# Patient Record
Sex: Female | Born: 1945
Health system: Southern US, Community
[De-identification: ages and names within clinical notes are randomized; demographics above are authoritative.]

## PROBLEM LIST (undated history)

## (undated) DIAGNOSIS — M810 Age-related osteoporosis without current pathological fracture: Secondary | ICD-10-CM

## (undated) DIAGNOSIS — C801 Malignant (primary) neoplasm, unspecified: Secondary | ICD-10-CM

## (undated) DIAGNOSIS — D649 Anemia, unspecified: Secondary | ICD-10-CM

## (undated) HISTORY — DX: Age-related osteoporosis without current pathological fracture: M81.0

## (undated) HISTORY — PX: NO PAST SURGERIES: SHX2092

## (undated) HISTORY — DX: Anemia, unspecified: D64.9

---

## 2007-10-23 ENCOUNTER — Ambulatory Visit: Payer: Self-pay | Admitting: Internal Medicine

## 2007-12-31 ENCOUNTER — Ambulatory Visit: Payer: Self-pay | Admitting: Gastroenterology

## 2009-03-24 ENCOUNTER — Ambulatory Visit: Payer: Self-pay | Admitting: Internal Medicine

## 2010-03-30 ENCOUNTER — Ambulatory Visit: Payer: Self-pay | Admitting: Internal Medicine

## 2011-02-23 LAB — HM PAP SMEAR

## 2011-04-19 ENCOUNTER — Ambulatory Visit: Payer: Self-pay | Admitting: Internal Medicine

## 2012-06-21 ENCOUNTER — Encounter: Payer: Self-pay | Admitting: *Deleted

## 2012-06-24 ENCOUNTER — Encounter: Payer: Self-pay | Admitting: Internal Medicine

## 2012-06-24 ENCOUNTER — Other Ambulatory Visit (HOSPITAL_COMMUNITY)
Admission: RE | Admit: 2012-06-24 | Discharge: 2012-06-24 | Disposition: A | Discharge status: Home / Self Care | Payer: Medicare Other | Source: Ambulatory Visit | Attending: Internal Medicine | Admitting: Internal Medicine

## 2012-06-24 ENCOUNTER — Ambulatory Visit (INDEPENDENT_AMBULATORY_CARE_PROVIDER_SITE_OTHER): Payer: Medicare Other | Admitting: Internal Medicine

## 2012-06-24 VITALS — BP 118/72 | HR 60 | Temp 98.1°F | Ht 66.0 in | Wt 124.0 lb

## 2012-06-24 DIAGNOSIS — Z0000 Encounter for general adult medical examination without abnormal findings: Secondary | ICD-10-CM

## 2012-06-24 DIAGNOSIS — R5383 Other fatigue: Secondary | ICD-10-CM

## 2012-06-24 DIAGNOSIS — Z01419 Encounter for gynecological examination (general) (routine) without abnormal findings: Secondary | ICD-10-CM | POA: Insufficient documentation

## 2012-06-24 DIAGNOSIS — R5381 Other malaise: Secondary | ICD-10-CM

## 2012-06-24 DIAGNOSIS — M810 Age-related osteoporosis without current pathological fracture: Secondary | ICD-10-CM

## 2012-06-24 DIAGNOSIS — Z139 Encounter for screening, unspecified: Secondary | ICD-10-CM

## 2012-06-24 DIAGNOSIS — E7800 Pure hypercholesterolemia, unspecified: Secondary | ICD-10-CM

## 2012-06-24 DIAGNOSIS — Z1151 Encounter for screening for human papillomavirus (HPV): Secondary | ICD-10-CM | POA: Insufficient documentation

## 2012-06-24 DIAGNOSIS — D649 Anemia, unspecified: Secondary | ICD-10-CM

## 2012-06-24 LAB — CBC WITH DIFFERENTIAL/PLATELET
Basophils Absolute: 0 10*3/uL (ref 0.0–0.1)
Basophils Relative: 0.6 % (ref 0.0–3.0)
Eosinophils Relative: 4 % (ref 0.0–5.0)
HCT: 41.4 % (ref 36.0–46.0)
Hemoglobin: 13.9 g/dL (ref 12.0–15.0)
Lymphs Abs: 2.4 10*3/uL (ref 0.7–4.0)
Monocytes Relative: 7.3 % (ref 3.0–12.0)
Neutro Abs: 2.6 10*3/uL (ref 1.4–7.7)
RBC: 4.37 Mil/uL (ref 3.87–5.11)
RDW: 13.4 % (ref 11.5–14.6)

## 2012-06-24 LAB — LDL CHOLESTEROL, DIRECT: Direct LDL: 157.7 mg/dL

## 2012-06-24 LAB — COMPREHENSIVE METABOLIC PANEL
BUN: 17 mg/dL (ref 6–23)
CO2: 28 mEq/L (ref 19–32)
Calcium: 8.9 mg/dL (ref 8.4–10.5)
Creatinine, Ser: 0.8 mg/dL (ref 0.4–1.2)
GFR: 77.94 mL/min (ref >60.00)
Glucose, Bld: 94 mg/dL (ref 70–99)
Total Bilirubin: 0.6 mg/dL (ref 0.3–1.2)

## 2012-06-24 LAB — TSH: TSH: 2.09 u[IU]/mL (ref 0.35–5.50)

## 2012-06-24 LAB — LIPID PANEL: Total CHOL/HDL Ratio: 5

## 2012-06-26 ENCOUNTER — Encounter: Payer: Self-pay | Admitting: *Deleted

## 2012-07-04 ENCOUNTER — Encounter: Payer: Self-pay | Admitting: Internal Medicine

## 2012-07-04 NOTE — Assessment & Plan Note (Addendum)
Schedule bone density.  Continues on fosamax, calcium and vitamin D.  Continue weight bearing exercises.  Check vitamin D level.

## 2012-07-04 NOTE — Progress Notes (Signed)
Subjective:    Patient ID: Sonya Hernandez, female    DOB: 06/12/1945, 67 y.o.   MRN: [id number]  HPI 67 year old female with past history of osteoporosis and documented iron deficient anemia who comes in today to follow up on these issues as well as for a complete physical exam.  She is exercising.  Some discomfort in her knees.  Left greater than right.  Occasionally will notice a pain in her back - occasionally a shooting pain down her leg.  Takes advil and this controls.  Desires no further intervention.  No significant acid reflux.  States if she eats too much - will notice some acid reflux.  If controls her diet, does not have issues.  Bowels move normally.  No chest pain or tightness.    Past Medical History  Diagnosis Date  . Anemia   . Osteoporosis     Current Outpatient Prescriptions on File Prior to Visit  Medication Sig Dispense Refill  . alendronate (FOSAMAX) 70 MG tablet Take 70 mg by mouth every 7 (seven) days. Take with a full glass of water on an empty stomach.      . Calcium Carbonate-Vitamin D 600-400 MG-UNIT per chew tablet Chew 1 tablet by mouth 2 (two) times daily.       . Cholecalciferol 1000 UNITS capsule Take 1,000 Units by mouth daily.      . ferrous sulfate 325 (65 FE) MG EC tablet Take 325 mg by mouth daily with breakfast.        Review of Systems Patient denies any headache, lightheadedness or dizziness.  No significant sinus or allergy symptoms.  No chest pain, tightness or palpitations.  No increased shortness of breath, cough or congestion.  No nausea or vomiting.  Acid reflux as outlined above.  If she controls the amount she eats, she has no problems.  No abdominal pain or cramping.  No bowel change, such as diarrhea, constipation, BRBPR or melana.  No urine change.   No vaginal problems.  Reported some fatigue, but overall she feels she is doing well.      Objective:   Physical Exam Filed Vitals:   06/24/12 0936  BP: 118/72  Pulse: 60  Temp: 98.1 F  (16.42 C)   67 year old female in no acute distress.   HEENT:  Nares- clear.  Oropharynx - without lesions. NECK:  Supple.  Nontender.  No audible bruit.  HEART:  Appears to be regular. LUNGS:  No crackles or wheezing audible.  Respirations even and unlabored.  RADIAL PULSE:  Equal bilaterally.    BREASTS:  No nipple discharge or nipple retraction present.  Could not appreciate any distinct nodules or axillary adenopathy.  ABDOMEN:  Soft, nontender.  Bowel sounds present and normal.  No audible abdominal bruit.  GU:  Normal external genitalia.  Vaginal vault without lesions.  Cervix identified.  Pap performed. Could not appreciate any adnexal masses or tenderness.   RECTAL:  Heme negative.   EXTREMITIES:  No increased edema present.  DP pulses palpable and equal bilaterally.      MSK:  No increased soft tissue swelling.  Good rom.       Assessment & Plan:  PREVIOUS WEIGHT LOSS.  Weight stable.  Over the last couple of years, weight averaging 122-124.  Follow.    FATIGUE.  Check cbc, met c and tsh.    GYN.  Pelvic/pap today.   HEALTH MAINTENANCE.  Physical today.  Pelvic/pap today.  Schedule mammogram.  Colonoscopy 12/31/07 revealed diverticulosis otherwise normal.  Mother had colon cancer.  Continue to follow up with GI.  IFOB.

## 2012-07-04 NOTE — Assessment & Plan Note (Signed)
On iron.  Check cbc/ferritin.  

## 2012-07-30 ENCOUNTER — Telehealth: Payer: Self-pay | Admitting: *Deleted

## 2012-07-30 NOTE — Telephone Encounter (Signed)
Called patient back about her continuing with her calcium and vitamin d.

## 2012-08-12 ENCOUNTER — Ambulatory Visit: Payer: Self-pay | Admitting: Internal Medicine

## 2012-08-14 ENCOUNTER — Ambulatory Visit: Payer: Self-pay | Admitting: Internal Medicine

## 2012-08-14 ENCOUNTER — Encounter: Payer: Self-pay | Admitting: Internal Medicine

## 2012-08-14 LAB — HM MAMMOGRAPHY

## 2012-08-16 ENCOUNTER — Telehealth: Payer: Self-pay | Admitting: Internal Medicine

## 2012-08-16 NOTE — Telephone Encounter (Signed)
error 

## 2012-09-02 ENCOUNTER — Encounter: Payer: Self-pay | Admitting: Internal Medicine

## 2012-09-23 ENCOUNTER — Other Ambulatory Visit: Payer: Self-pay | Admitting: *Deleted

## 2012-09-23 ENCOUNTER — Encounter: Payer: Self-pay | Admitting: *Deleted

## 2012-09-23 MED ORDER — ALENDRONATE SODIUM 70 MG PO TABS: 70.0000 mg | ORAL_TABLET | ORAL | Status: DC

## 2012-09-25 ENCOUNTER — Emergency Department: Payer: Self-pay | Admitting: Emergency Medicine

## 2012-09-25 LAB — COMPREHENSIVE METABOLIC PANEL
Albumin: 3.5 g/dL (ref 3.4–5.0)
Alkaline Phosphatase: 70 U/L (ref 50–136)
BUN: 21 mg/dL — ABNORMAL HIGH (ref 7–18)
Chloride: 106 mmol/L (ref 98–107)
EGFR (African American): >60
Glucose: 135 mg/dL — ABNORMAL HIGH (ref 65–99)
Potassium: 3.9 mmol/L (ref 3.5–5.1)
SGOT(AST): 33 U/L (ref 15–37)
SGPT (ALT): 26 U/L (ref 12–78)
Sodium: 138 mmol/L (ref 136–145)

## 2012-09-25 LAB — CBC WITH DIFFERENTIAL/PLATELET
Basophil %: 0.6 %
Eosinophil #: 0.2 10*3/uL (ref 0.0–0.7)
HCT: 40.2 % (ref 35.0–47.0)
Lymphocyte #: 2.8 10*3/uL (ref 1.0–3.6)
MCH: 31.4 pg (ref 26.0–34.0)
MCHC: 33.1 g/dL (ref 32.0–36.0)
Neutrophil #: 5.4 10*3/uL (ref 1.4–6.5)
Platelet: 238 10*3/uL (ref 150–440)
RBC: 4.23 10*6/uL (ref 3.80–5.20)

## 2012-09-26 ENCOUNTER — Ambulatory Visit: Payer: Self-pay | Admitting: Orthopedic Surgery

## 2012-11-04 ENCOUNTER — Encounter: Payer: Self-pay | Admitting: Orthopedic Surgery

## 2012-12-03 ENCOUNTER — Encounter: Payer: Self-pay | Admitting: Orthopedic Surgery

## 2013-01-03 ENCOUNTER — Encounter: Payer: Self-pay | Admitting: Orthopedic Surgery

## 2013-01-15 ENCOUNTER — Telehealth: Payer: Self-pay | Admitting: *Deleted

## 2013-01-15 MED ORDER — ALENDRONATE SODIUM 70 MG PO TABS: 70.0000 mg | ORAL_TABLET | ORAL | Status: DC

## 2013-01-15 NOTE — Telephone Encounter (Signed)
Refill Request  Alendronate sodium 70 mg  #4   Take one tablet by mouth once a week as directed

## 2013-01-15 NOTE — Telephone Encounter (Signed)
done

## 2013-02-18 ENCOUNTER — Other Ambulatory Visit (INDEPENDENT_AMBULATORY_CARE_PROVIDER_SITE_OTHER): Payer: Medicare Other

## 2013-02-18 DIAGNOSIS — Z139 Encounter for screening, unspecified: Secondary | ICD-10-CM

## 2013-02-18 LAB — FECAL OCCULT BLOOD, IMMUNOCHEMICAL: Fecal Occult Bld: NEGATIVE

## 2013-02-19 ENCOUNTER — Encounter: Payer: Self-pay | Admitting: *Deleted

## 2013-03-31 LAB — HM DEXA SCAN

## 2013-04-11 ENCOUNTER — Encounter: Payer: Self-pay | Admitting: Internal Medicine

## 2013-04-11 ENCOUNTER — Encounter: Payer: Self-pay | Admitting: *Deleted

## 2013-04-11 DIAGNOSIS — K5790 Diverticulosis of intestine, part unspecified, without perforation or abscess without bleeding: Secondary | ICD-10-CM

## 2013-06-02 ENCOUNTER — Ambulatory Visit: Payer: Self-pay | Admitting: Gastroenterology

## 2013-06-02 LAB — HM COLONOSCOPY

## 2013-06-23 ENCOUNTER — Encounter: Payer: Medicare Other | Admitting: Internal Medicine

## 2013-06-25 ENCOUNTER — Ambulatory Visit (INDEPENDENT_AMBULATORY_CARE_PROVIDER_SITE_OTHER): Payer: Medicare Other | Admitting: Internal Medicine

## 2013-06-25 ENCOUNTER — Encounter: Payer: Self-pay | Admitting: Internal Medicine

## 2013-06-25 ENCOUNTER — Other Ambulatory Visit: Payer: Self-pay | Admitting: Internal Medicine

## 2013-06-25 ENCOUNTER — Encounter (INDEPENDENT_AMBULATORY_CARE_PROVIDER_SITE_OTHER): Payer: Self-pay

## 2013-06-25 VITALS — BP 110/70 | HR 59 | Temp 98.0°F | Ht 65.25 in | Wt 121.5 lb

## 2013-06-25 DIAGNOSIS — D649 Anemia, unspecified: Secondary | ICD-10-CM

## 2013-06-25 DIAGNOSIS — S62101A Fracture of unspecified carpal bone, right wrist, initial encounter for closed fracture: Secondary | ICD-10-CM

## 2013-06-25 DIAGNOSIS — M810 Age-related osteoporosis without current pathological fracture: Secondary | ICD-10-CM

## 2013-06-25 DIAGNOSIS — S62109A Fracture of unspecified carpal bone, unspecified wrist, initial encounter for closed fracture: Secondary | ICD-10-CM

## 2013-06-25 DIAGNOSIS — E875 Hyperkalemia: Secondary | ICD-10-CM

## 2013-06-25 DIAGNOSIS — E7800 Pure hypercholesterolemia, unspecified: Secondary | ICD-10-CM

## 2013-06-25 DIAGNOSIS — Z23 Encounter for immunization: Secondary | ICD-10-CM

## 2013-06-25 LAB — COMPREHENSIVE METABOLIC PANEL
ALBUMIN: 3.7 g/dL (ref 3.5–5.2)
ALK PHOS: 74 U/L (ref 39–117)
ALT: 18 U/L (ref 0–35)
AST: 24 U/L (ref 0–37)
BUN: 13 mg/dL (ref 6–23)
CALCIUM: 9.2 mg/dL (ref 8.4–10.5)
CHLORIDE: 106 meq/L (ref 96–112)
CO2: 30 mEq/L (ref 19–32)
Creatinine, Ser: 0.8 mg/dL (ref 0.4–1.2)
GFR: 80.07 mL/min (ref >60.00)
GLUCOSE: 92 mg/dL (ref 70–99)
Potassium: 5.1 mEq/L (ref 3.5–5.1)
SODIUM: 140 meq/L (ref 135–145)
TOTAL PROTEIN: 6.5 g/dL (ref 6.0–8.3)
Total Bilirubin: 0.6 mg/dL (ref 0.3–1.2)

## 2013-06-25 LAB — CBC WITH DIFFERENTIAL/PLATELET
BASOS PCT: 0.4 % (ref 0.0–3.0)
Basophils Absolute: 0 10*3/uL (ref 0.0–0.1)
EOS PCT: 3.4 % (ref 0.0–5.0)
Eosinophils Absolute: 0.2 10*3/uL (ref 0.0–0.7)
HCT: 39.1 % (ref 36.0–46.0)
Hemoglobin: 13.1 g/dL (ref 12.0–15.0)
Lymphocytes Relative: 45.9 % (ref 12.0–46.0)
Lymphs Abs: 2.5 10*3/uL (ref 0.7–4.0)
MCHC: 33.5 g/dL (ref 30.0–36.0)
MCV: 96.1 fl (ref 78.0–100.0)
MONO ABS: 0.4 10*3/uL (ref 0.1–1.0)
MONOS PCT: 7.3 % (ref 3.0–12.0)
Neutro Abs: 2.3 10*3/uL (ref 1.4–7.7)
Neutrophils Relative %: 43 % (ref 43.0–77.0)
Platelets: 231 10*3/uL (ref 150.0–400.0)
RBC: 4.07 Mil/uL (ref 3.87–5.11)
RDW: 13.2 % (ref 11.5–14.6)
WBC: 5.4 10*3/uL (ref 4.5–10.5)

## 2013-06-25 LAB — TSH: TSH: 1 u[IU]/mL (ref 0.35–5.50)

## 2013-06-25 LAB — LIPID PANEL
CHOLESTEROL: 191 mg/dL (ref 0–200)
HDL: 47.3 mg/dL (ref >39.00)
LDL Cholesterol: 132 mg/dL — ABNORMAL HIGH (ref 0–99)
Total CHOL/HDL Ratio: 4
Triglycerides: 61 mg/dL (ref 0.0–149.0)
VLDL: 12.2 mg/dL (ref 0.0–40.0)

## 2013-06-25 MED ORDER — HYDROCORTISONE ACE-PRAMOXINE 1-1 % RE CREA: 1.0000 "application " | TOPICAL_CREAM | Freq: Two times a day (BID) | RECTAL | Status: DC

## 2013-06-25 NOTE — Progress Notes (Signed)
Lab ordered.

## 2013-06-25 NOTE — Progress Notes (Signed)
Pre-visit discussion using our clinic review tool. No additional management support is needed unless otherwise documented below in the visit note.  

## 2013-06-25 NOTE — Progress Notes (Signed)
Orders placed for labs

## 2013-06-26 LAB — VITAMIN D 25 HYDROXY (VIT D DEFICIENCY, FRACTURES): VIT D 25 HYDROXY: 44 ng/mL (ref 30–89)

## 2013-06-27 ENCOUNTER — Encounter: Payer: Self-pay | Admitting: Internal Medicine

## 2013-06-29 ENCOUNTER — Encounter: Payer: Self-pay | Admitting: Internal Medicine

## 2013-06-29 DIAGNOSIS — S62101A Fracture of unspecified carpal bone, right wrist, initial encounter for closed fracture: Secondary | ICD-10-CM | POA: Insufficient documentation

## 2013-06-29 NOTE — Assessment & Plan Note (Signed)
On iron.  Check cbc/ferritin.  

## 2013-06-29 NOTE — Progress Notes (Signed)
Subjective:    Patient ID: Sonya Hernandez, female    DOB: 1945/11/27, 68 y.o.   MRN: [id number]  HPI 68 year old female with past history of osteoporosis and documented iron deficient anemia who comes in today to follow up on these issues as well as for a complete physical exam.  She is exercising.  No cardiac symptoms with increased activity or exertion.   No significant acid reflux.  Bowels move normally.  Reports occasional loose stool.  Will occasionally feel a tear with having a bowel movement.  Some BRB at times with wiping.  Just had a colonoscopy.  Performed by Dr Gustavo Lah.  No abdominal pain or cramping.  Did have a right wrist fracture.  States wrist shattered.  Has been released.  Not able to grip as well.  Saw Dr Rudene Christians.      Past Medical History  Diagnosis Date  . Anemia   . Osteoporosis     Current Outpatient Prescriptions on File Prior to Visit  Medication Sig Dispense Refill  . alendronate (FOSAMAX) 70 MG tablet Take 1 tablet (70 mg total) by mouth every 7 (seven) days. Take with a full glass of water on an empty stomach.  4 tablet  6  . Calcium Carbonate-Vitamin D 600-400 MG-UNIT per chew tablet Chew 1 tablet by mouth 2 (two) times daily.       . Cholecalciferol 1000 UNITS capsule Take 1,000 Units by mouth daily.      Marland Kitchen estradiol (ESTRACE) 0.1 MG/GM vaginal cream Place 2 g vaginally 3 (three) times a week.       . ferrous sulfate 325 (65 FE) MG EC tablet Take 325 mg by mouth daily with breakfast.       No current facility-administered medications on file prior to visit.    Review of Systems Patient denies any headache, lightheadedness or dizziness.  No significant sinus or allergy symptoms.  No chest pain, tightness or palpitations.  No increased shortness of breath, cough or congestion.  No nausea or vomiting.  No acid reflux reported.  No abdominal pain or cramping.  No significant bowel change.  Does report the BRB as outlined.  No urine change.   No vaginal problems.   Reported some fatigue, but overall she feels she is doing well.  Eating well.       Objective:   Physical Exam  Filed Vitals:   06/25/13 0915  BP: 110/70  Pulse: 59  Temp: 98 F (43.48 C)   68 year old female in no acute distress.   HEENT:  Nares- clear.  Oropharynx - without lesions. NECK:  Supple.  Nontender.  No audible bruit.  HEART:  Appears to be regular. LUNGS:  No crackles or wheezing audible.  Respirations even and unlabored.  RADIAL PULSE:  Equal bilaterally.    BREASTS:  No nipple discharge or nipple retraction present.  Could not appreciate any distinct nodules or axillary adenopathy.  ABDOMEN:  Soft, nontender.  Bowel sounds present and normal.  No audible abdominal bruit.  GU:  Normal external genitalia.  Vaginal vault without lesions.  Cervix identified.  Pap performed. Could not appreciate any adnexal masses or tenderness.   RECTAL:  Heme negative.   EXTREMITIES:  No increased edema present.  DP pulses palpable and equal bilaterally.      MSK:  No increased soft tissue swelling.  Good rom.       Assessment & Plan:  PREVIOUS WEIGHT LOSS.  Weight stable.  Over the last  couple of years, weight averaging 122-124.  Follow.    FATIGUE.  Check cbc, met c and tsh.    GYN.  Pap 06/24/12 negative with negative HPV.    HEALTH MAINTENANCE.  Physical today.  Pelvic/pap 06/24/12 negative with negative HPV.  Schedule mammogram when due.  Last mammogram 08/12/12 recommended f/u views.  F/u mammogram 08/14/12 - Birads II.   Colonoscopy 12/31/07 revealed diverticulosis otherwise normal.  Mother had colon cancer.  Just had f/u colonoscopy.  States ok.    I spent more 25 minutes with the patient and more than 50% of the time was spent in consultation regarding the above.

## 2013-06-29 NOTE — Assessment & Plan Note (Signed)
S/p fracture.  Saw Dr Menz.  Stable.    

## 2013-06-29 NOTE — Assessment & Plan Note (Signed)
Schedule bone density.  Continues on fosamaxand vitamin D.  Continue weight bearing exercises.  Follow vitamin D level.

## 2013-07-10 ENCOUNTER — Encounter: Payer: Self-pay | Admitting: Internal Medicine

## 2013-07-11 DIAGNOSIS — K5790 Diverticulosis of intestine, part unspecified, without perforation or abscess without bleeding: Secondary | ICD-10-CM | POA: Insufficient documentation

## 2013-07-23 ENCOUNTER — Encounter: Payer: Self-pay | Admitting: *Deleted

## 2013-07-23 ENCOUNTER — Other Ambulatory Visit (INDEPENDENT_AMBULATORY_CARE_PROVIDER_SITE_OTHER): Payer: Medicare Other

## 2013-07-23 DIAGNOSIS — E875 Hyperkalemia: Secondary | ICD-10-CM

## 2013-07-23 LAB — POTASSIUM: Potassium: 4.2 mEq/L (ref 3.5–5.1)

## 2013-08-03 ENCOUNTER — Encounter: Payer: Self-pay | Admitting: Internal Medicine

## 2013-08-07 ENCOUNTER — Encounter: Payer: Self-pay | Admitting: Internal Medicine

## 2013-08-21 ENCOUNTER — Other Ambulatory Visit: Payer: Self-pay | Admitting: *Deleted

## 2013-08-21 MED ORDER — ALENDRONATE SODIUM 70 MG PO TABS
70.0000 mg | ORAL_TABLET | ORAL | Status: DC
Start: 2013-08-21 — End: 2014-03-05

## 2013-12-23 ENCOUNTER — Ambulatory Visit (INDEPENDENT_AMBULATORY_CARE_PROVIDER_SITE_OTHER): Payer: Medicare Other | Admitting: Internal Medicine

## 2013-12-23 ENCOUNTER — Encounter: Payer: Self-pay | Admitting: *Deleted

## 2013-12-23 ENCOUNTER — Encounter: Payer: Self-pay | Admitting: Internal Medicine

## 2013-12-23 VITALS — BP 100/58 | HR 61 | Temp 97.9°F | Ht 65.25 in | Wt 127.8 lb

## 2013-12-23 DIAGNOSIS — S42309S Unspecified fracture of shaft of humerus, unspecified arm, sequela: Secondary | ICD-10-CM

## 2013-12-23 DIAGNOSIS — E7800 Pure hypercholesterolemia, unspecified: Secondary | ICD-10-CM

## 2013-12-23 DIAGNOSIS — S62101S Fracture of unspecified carpal bone, right wrist, sequela: Secondary | ICD-10-CM

## 2013-12-23 DIAGNOSIS — K5730 Diverticulosis of large intestine without perforation or abscess without bleeding: Secondary | ICD-10-CM

## 2013-12-23 DIAGNOSIS — D649 Anemia, unspecified: Secondary | ICD-10-CM

## 2013-12-23 DIAGNOSIS — M810 Age-related osteoporosis without current pathological fracture: Secondary | ICD-10-CM

## 2013-12-23 LAB — LIPID PANEL
CHOLESTEROL: 179 mg/dL (ref 0–200)
HDL: 48.7 mg/dL (ref >39.00)
LDL CALC: 120 mg/dL — AB (ref 0–99)
NonHDL: 130.3
Total CHOL/HDL Ratio: 4
Triglycerides: 51 mg/dL (ref 0.0–149.0)
VLDL: 10.2 mg/dL (ref 0.0–40.0)

## 2013-12-23 NOTE — Progress Notes (Signed)
Pre visit review using our clinic review tool, if applicable. No additional management support is needed unless otherwise documented below in the visit note. 

## 2013-12-24 ENCOUNTER — Encounter: Payer: Self-pay | Admitting: Internal Medicine

## 2013-12-24 NOTE — Assessment & Plan Note (Signed)
On iron.  Follow cbc and ferritin.  

## 2013-12-24 NOTE — Progress Notes (Signed)
Subjective:    Patient ID: Sonya Hernandez, female    DOB: 1946/01/03, 68 y.o.   MRN: [id number]  HPI 68 year old female with past history of osteoporosis and documented iron deficient anemia who comes in today for a scheduled follow up.   She is exercising.  No cardiac symptoms with increased activity or exertion.   No significant acid reflux.  No reported problems with her bowels.  Had a colonoscopy.  Performed by Dr Gustavo Lah.  No abdominal pain or cramping.  Did have a right wrist fracture.  States wrist shattered.  Has been released.  Overall doing well.  Will still have some discomfort in the wrist - at times.  Overall feels good.        Past Medical History  Diagnosis Date  . Anemia   . Osteoporosis     Current Outpatient Prescriptions on File Prior to Visit  Medication Sig Dispense Refill  . alendronate (FOSAMAX) 70 MG tablet Take 1 tablet (70 mg total) by mouth every 7 (seven) days. Take with a full glass of water on an empty stomach.  4 tablet  5  . Calcium Carbonate-Vitamin D 600-400 MG-UNIT per chew tablet Chew 1 tablet by mouth 2 (two) times daily.       . Cholecalciferol 1000 UNITS capsule Take 1,000 Units by mouth daily.      . clobetasol cream (TEMOVATE) 1.66 % Apply 1 application topically 2 (two) times a week.      . estradiol (ESTRACE) 0.1 MG/GM vaginal cream Place 2 g vaginally 3 (three) times a week.       . ferrous sulfate 325 (65 FE) MG EC tablet Take 325 mg by mouth daily with breakfast.      . pramoxine-hydrocortisone (ANALPRAM-HC) 1-1 % rectal cream Place 1 application rectally 2 (two) times daily.  30 g  0   No current facility-administered medications on file prior to visit.    Review of Systems Patient denies any headache, lightheadedness or dizziness.  No significant sinus or allergy symptoms.  No chest pain, tightness or palpitations.  No increased shortness of breath, cough or congestion.  No nausea or vomiting.  No acid reflux reported.  No abdominal pain  or cramping.  No bowel problems reported.   No urine change.   No vaginal problems.  Eating well.  Weight up a few pounds.       Objective:   Physical Exam  Filed Vitals:   12/23/13 0801  BP: 100/58  Pulse: 61  Temp: 97.9 F (69.21 C)   68 year old female in no acute distress.   HEENT:  Nares- clear.  Oropharynx - without lesions. NECK:  Supple.  Nontender.  No audible bruit.  HEART:  Appears to be regular. LUNGS:  No crackles or wheezing audible.  Respirations even and unlabored.  RADIAL PULSE:  Equal bilaterally.  ABDOMEN:  Soft, nontender.  Bowel sounds present and normal.  No audible abdominal bruit.   EXTREMITIES:  No increased edema present.  DP pulses palpable and equal bilaterally.       Assessment & Plan:  PREVIOUS WEIGHT LOSS.  Weight up a few pounds.   Follow.    GYN.  Pap 06/24/12 negative with negative HPV.    HEALTH MAINTENANCE.  Physical 06/25/13.  Pelvic/pap 06/24/12 negative with negative HPV.  Last mammogram 08/12/12 recommended f/u views.  F/u mammogram 08/14/12 - Birads II.   She missed her previous mammogram and had to reschedule.  She is scheduled  for f/u mammogram 9/15.  Colonoscopy 12/31/07 revealed diverticulosis otherwise normal.  Mother had colon cancer.  Follow up colonoscopy 12/14 - diverticulosis.  Recommended f/u colonoscopy in five years.

## 2013-12-24 NOTE — Assessment & Plan Note (Signed)
No bowel problems reported.  Follow.  Colonoscopy as outlined.

## 2013-12-24 NOTE — Assessment & Plan Note (Signed)
Continues on fosamax and vitamin D.  Continue weight bearing exercises.  Follow vitamin D level.  Last check wnl.  Recent bone density - hip improved.

## 2013-12-24 NOTE — Assessment & Plan Note (Signed)
S/p fracture.  Saw Dr Rudene Christians.  Stable.

## 2014-02-16 ENCOUNTER — Encounter: Payer: Self-pay | Admitting: *Deleted

## 2014-02-16 ENCOUNTER — Ambulatory Visit: Payer: Self-pay | Admitting: Internal Medicine

## 2014-02-16 LAB — HM MAMMOGRAPHY: HM Mammogram: NEGATIVE

## 2014-03-05 ENCOUNTER — Other Ambulatory Visit: Payer: Self-pay | Admitting: *Deleted

## 2014-03-05 MED ORDER — ALENDRONATE SODIUM 70 MG PO TABS: 70.0000 mg | ORAL_TABLET | ORAL | Status: DC

## 2014-06-25 ENCOUNTER — Ambulatory Visit (INDEPENDENT_AMBULATORY_CARE_PROVIDER_SITE_OTHER): Payer: Medicare Other | Admitting: Internal Medicine

## 2014-06-25 ENCOUNTER — Encounter: Payer: Self-pay | Admitting: Internal Medicine

## 2014-06-25 VITALS — BP 110/70 | HR 54 | Temp 98.2°F | Ht 65.0 in | Wt 125.2 lb

## 2014-06-25 DIAGNOSIS — K5730 Diverticulosis of large intestine without perforation or abscess without bleeding: Secondary | ICD-10-CM | POA: Diagnosis not present

## 2014-06-25 DIAGNOSIS — Z13220 Encounter for screening for lipoid disorders: Secondary | ICD-10-CM | POA: Diagnosis not present

## 2014-06-25 DIAGNOSIS — D649 Anemia, unspecified: Secondary | ICD-10-CM | POA: Diagnosis not present

## 2014-06-25 DIAGNOSIS — Z23 Encounter for immunization: Secondary | ICD-10-CM | POA: Diagnosis not present

## 2014-06-25 DIAGNOSIS — M810 Age-related osteoporosis without current pathological fracture: Secondary | ICD-10-CM | POA: Diagnosis not present

## 2014-06-25 LAB — CBC WITH DIFFERENTIAL/PLATELET
BASOS PCT: 0.5 % (ref 0.0–3.0)
Basophils Absolute: 0 10*3/uL (ref 0.0–0.1)
EOS ABS: 0.2 10*3/uL (ref 0.0–0.7)
EOS PCT: 4 % (ref 0.0–5.0)
HCT: 40.4 % (ref 36.0–46.0)
Hemoglobin: 13.7 g/dL (ref 12.0–15.0)
Lymphocytes Relative: 46.5 % — ABNORMAL HIGH (ref 12.0–46.0)
Lymphs Abs: 2.7 10*3/uL (ref 0.7–4.0)
MCHC: 33.9 g/dL (ref 30.0–36.0)
MCV: 93.8 fl (ref 78.0–100.0)
MONOS PCT: 7.1 % (ref 3.0–12.0)
Monocytes Absolute: 0.4 10*3/uL (ref 0.1–1.0)
Neutro Abs: 2.5 10*3/uL (ref 1.4–7.7)
Neutrophils Relative %: 41.9 % — ABNORMAL LOW (ref 43.0–77.0)
PLATELETS: 224 10*3/uL (ref 150.0–400.0)
RBC: 4.31 Mil/uL (ref 3.87–5.11)
RDW: 14 % (ref 11.5–15.5)
WBC: 5.9 10*3/uL (ref 4.0–10.5)

## 2014-06-25 LAB — FERRITIN: FERRITIN: 64.2 ng/mL (ref 10.0–291.0)

## 2014-06-25 NOTE — Progress Notes (Signed)
Pre visit review using our clinic review tool, if applicable. No additional management support is needed unless otherwise documented below in the visit note. 

## 2014-06-25 NOTE — Progress Notes (Signed)
Subjective:    Patient ID: Sonya Hernandez, female    DOB: 1945-07-20, 69 y.o.   MRN: [id number]  HPI 69 year old female with past history of osteoporosis and documented iron deficient anemia who comes in today to follow up on these issues as well as for a complete physical exam.   She is exercising.  No cardiac symptoms with increased activity or exertion.   No significant acid reflux.  No reported problems with her bowels.  No abdominal pain or cramping.   Overall doing well.  Overall feels good.        Past Medical History  Diagnosis Date  . Anemia   . Osteoporosis     Current Outpatient Prescriptions on File Prior to Visit  Medication Sig Dispense Refill  . alendronate (FOSAMAX) 70 MG tablet Take 1 tablet (70 mg total) by mouth every 7 (seven) days. Take with a full glass of water on an empty stomach. 4 tablet 5  . Calcium Carbonate-Vitamin D 600-400 MG-UNIT per chew tablet Chew 1 tablet by mouth 2 (two) times daily.     . Cholecalciferol 1000 UNITS capsule Take 1,000 Units by mouth daily.    . clobetasol cream (TEMOVATE) 3.41 % Apply 1 application topically 2 (two) times a week.    . estradiol (ESTRACE) 0.1 MG/GM vaginal cream Place 2 g vaginally 3 (three) times a week.     . ferrous sulfate 325 (65 FE) MG EC tablet Take 325 mg by mouth daily with breakfast.     No current facility-administered medications on file prior to visit.    Review of Systems Patient denies any headache, lightheadedness or dizziness.  No significant sinus or allergy symptoms.  No chest pain, tightness or palpitations.  No increased shortness of breath, cough or congestion.  No nausea or vomiting.  No acid reflux reported.  No abdominal pain or cramping.  No bowel problems reported.   No urine change.   No vaginal problems.  Eating well.  Weight overall stable.       Objective:   Physical Exam  Filed Vitals:   06/25/14 0835  BP: 110/70  Pulse: 54  Temp: 98.2 F (36.8 C)   Blood pressure recheck:   36/84  69 year old female in no acute distress.   HEENT:  Nares- clear.  Oropharynx - without lesions. NECK:  Supple.  Nontender.  No audible bruit.  HEART:  Appears to be regular. LUNGS:  No crackles or wheezing audible.  Respirations even and unlabored.  RADIAL PULSE:  Equal bilaterally.    BREASTS:  No nipple discharge or nipple retraction present.  Could not appreciate any distinct nodules or axillary adenopathy.  ABDOMEN:  Soft, nontender.  Bowel sounds present and normal.  No audible abdominal bruit.  GU:  Not performed.     EXTREMITIES:  No increased edema present.  DP pulses palpable and equal bilaterally.          Assessment & Plan:  Encounter for immunization Flu shot given today.    Osteoporosis Had bone density in 2014.  Improved.  Still osteoporosis.  Follow.  - Comprehensive metabolic panel; Future - TSH; Future - Vit D  25 hydroxy (rtn osteoporosis monitoring); Future  Anemia, unspecified anemia type On iron.  Check today and see if can adjust iron dosing.   - CBC with Differential - Ferritin  Diverticulosis of large intestine without hemorrhage Colonoscopy 12/14 - diverticulosis.  Stable.    Screening cholesterol level - Lipid panel;  Future  PREVIOUS WEIGHT LOSS.  Weight stable.   Follow.    GYN.  Pap 06/24/12 negative with negative HPV.    HEALTH MAINTENANCE.  Physical today.  Pelvic/pap 06/24/12 negative with negative HPV.  Mammogram 08/12/12 recommended f/u views.  F/u mammogram 08/14/12 - Birads II.   Mammogram 02/16/14 - Birads I.   Colonoscopy 12/31/07 revealed diverticulosis otherwise normal.  Mother had colon cancer.  Follow up colonoscopy 12/14 - diverticulosis.  Recommended f/u colonoscopy in five years.      I spent 25 minutes with the patient and more than 50% of the time was spent in consultation regarding the above.

## 2014-06-26 ENCOUNTER — Encounter: Payer: Self-pay | Admitting: Internal Medicine

## 2014-06-29 ENCOUNTER — Other Ambulatory Visit: Payer: Self-pay | Admitting: Internal Medicine

## 2014-06-29 DIAGNOSIS — D649 Anemia, unspecified: Secondary | ICD-10-CM

## 2014-06-29 NOTE — Progress Notes (Signed)
Orders placed for f/u cbc and ferritin.   

## 2014-09-16 DIAGNOSIS — M955 Acquired deformity of pelvis: Secondary | ICD-10-CM | POA: Diagnosis not present

## 2014-09-16 DIAGNOSIS — M9904 Segmental and somatic dysfunction of sacral region: Secondary | ICD-10-CM | POA: Diagnosis not present

## 2014-09-16 DIAGNOSIS — M5136 Other intervertebral disc degeneration, lumbar region: Secondary | ICD-10-CM | POA: Diagnosis not present

## 2014-09-16 DIAGNOSIS — M9903 Segmental and somatic dysfunction of lumbar region: Secondary | ICD-10-CM | POA: Diagnosis not present

## 2014-09-16 DIAGNOSIS — M9905 Segmental and somatic dysfunction of pelvic region: Secondary | ICD-10-CM | POA: Diagnosis not present

## 2014-09-17 ENCOUNTER — Other Ambulatory Visit: Payer: Self-pay | Admitting: *Deleted

## 2014-09-17 MED ORDER — ALENDRONATE SODIUM 70 MG PO TABS: 70.0000 mg | ORAL_TABLET | ORAL | Status: DC

## 2014-09-18 DIAGNOSIS — M9904 Segmental and somatic dysfunction of sacral region: Secondary | ICD-10-CM | POA: Diagnosis not present

## 2014-09-18 DIAGNOSIS — M9905 Segmental and somatic dysfunction of pelvic region: Secondary | ICD-10-CM | POA: Diagnosis not present

## 2014-09-18 DIAGNOSIS — M955 Acquired deformity of pelvis: Secondary | ICD-10-CM | POA: Diagnosis not present

## 2014-09-18 DIAGNOSIS — M9903 Segmental and somatic dysfunction of lumbar region: Secondary | ICD-10-CM | POA: Diagnosis not present

## 2014-09-18 DIAGNOSIS — M5136 Other intervertebral disc degeneration, lumbar region: Secondary | ICD-10-CM | POA: Diagnosis not present

## 2014-09-21 DIAGNOSIS — M5136 Other intervertebral disc degeneration, lumbar region: Secondary | ICD-10-CM | POA: Diagnosis not present

## 2014-09-21 DIAGNOSIS — M955 Acquired deformity of pelvis: Secondary | ICD-10-CM | POA: Diagnosis not present

## 2014-09-21 DIAGNOSIS — M9903 Segmental and somatic dysfunction of lumbar region: Secondary | ICD-10-CM | POA: Diagnosis not present

## 2014-09-21 DIAGNOSIS — M9904 Segmental and somatic dysfunction of sacral region: Secondary | ICD-10-CM | POA: Diagnosis not present

## 2014-09-21 DIAGNOSIS — M9905 Segmental and somatic dysfunction of pelvic region: Secondary | ICD-10-CM | POA: Diagnosis not present

## 2014-09-23 DIAGNOSIS — M955 Acquired deformity of pelvis: Secondary | ICD-10-CM | POA: Diagnosis not present

## 2014-09-23 DIAGNOSIS — M5136 Other intervertebral disc degeneration, lumbar region: Secondary | ICD-10-CM | POA: Diagnosis not present

## 2014-09-23 DIAGNOSIS — M9905 Segmental and somatic dysfunction of pelvic region: Secondary | ICD-10-CM | POA: Diagnosis not present

## 2014-09-23 DIAGNOSIS — M9904 Segmental and somatic dysfunction of sacral region: Secondary | ICD-10-CM | POA: Diagnosis not present

## 2014-09-23 DIAGNOSIS — M9903 Segmental and somatic dysfunction of lumbar region: Secondary | ICD-10-CM | POA: Diagnosis not present

## 2014-09-25 NOTE — Op Note (Signed)
PATIENT NAME:  Sonya Hernandez, Sonya Hernandez MR#:  [id number] DATE OF BIRTH:  [birth year]  DATE OF PROCEDURE:    PREOPERATIVE DIAGNOSIS:  Right distal radius fracture and ulna fracture, comminuted, displaced.  POSTOPERATIVE DIAGNOSIS:  Right distal radius fracture and ulna fracture, comminuted, displaced.   PROCEDURE:  Open reduction internal fixation right distal radius.   ANESTHESIA:  General.   SURGEON:  Laurene Footman, M.D.   DESCRIPTION OF PROCEDURE:  The patient was brought to the operating room and after adequate general anesthesia was obtained the right arm was prepped and draped in the usual sterile fashion.  Tourniquet applied to the upper arm.  After patient identification and timeout procedures were completed, fingertrap traction through the index and middle finger was applied with approximately 10 pounds.  The tourniquet raised.  At this point a skin volar approach was made centered over the FCR tendon from the distal wrist crease proximally.  The flexor sheath was incised and the tendon retracted radially.  The deep tendon sheath was then incised and the pronator elevated off the distal fragment.  There was extensive comminution with significant displacement of the distal fragments.  With mini C-arm for fluoroscopy, closed reduction was carried out with open reduction of some of these volar fragments.  A freer was used to elevate the dorsal distal fragment to get it in near anatomic alignment.  A standard short DVR plate from [employer] was then applied and put in the appropriate position along the distal radius.  A K wire was used to hold it in place and proximal screws inserted.  With the wrist in ulnar deviation and flexion, the K wire was used to give provisional fixation to the styloid fracture.  This restored radial inclination.  The smooth pegs were then used to fill the remaining holes of the distal plate with one multiaxis  screw to prevent impingement on the distal radial ulnar joint.   After these peg holes had been filled, traction was released and under fluoroscopic examination there appeared to be stable fixation of the distal fragments.  The radial inclination had been restored along with length as well as some volar angulation.  The ulna appeared to be anatomically aligned at this point and no additional fixation was placed in the ulna.  The tourniquet was let down.  The wound irrigated.  There is no significant bleeding.  The wound was closed with 3-0 Vicryl subcutaneously and 4-0 nylon for the skin.  Xeroform, 4 x 4's, Webril, and a volar splint maintaining the wrist in neutral were applied.  The patient was sent to the recovery room in stable condition.   ESTIMATED BLOOD LOSS:  Minimal.   COMPLICATIONS:  None.   SPECIMEN:  None.   TOURNIQUET TIME:  Was 42 minutes at 250 mmHg.   IMPLANT:  [employer], short standard volar locking plate with screws and pegs.     ____________________________ Laurene Footman, MD [email or web address] D: 09/26/2012 16:03:06 ET T: 09/26/2012 19:57:59 ET JOB#: [id number]  cc: Laurene Footman, MD, <Dictator> Laurene Footman MD ELECTRONICALLY SIGNED 09/26/2012 21:10

## 2014-09-28 ENCOUNTER — Other Ambulatory Visit (INDEPENDENT_AMBULATORY_CARE_PROVIDER_SITE_OTHER): Payer: Medicare Other

## 2014-09-28 DIAGNOSIS — M810 Age-related osteoporosis without current pathological fracture: Secondary | ICD-10-CM | POA: Diagnosis not present

## 2014-09-28 DIAGNOSIS — D649 Anemia, unspecified: Secondary | ICD-10-CM | POA: Diagnosis not present

## 2014-09-28 DIAGNOSIS — Z79899 Other long term (current) drug therapy: Secondary | ICD-10-CM | POA: Diagnosis not present

## 2014-09-28 DIAGNOSIS — M9904 Segmental and somatic dysfunction of sacral region: Secondary | ICD-10-CM | POA: Diagnosis not present

## 2014-09-28 DIAGNOSIS — Z13220 Encounter for screening for lipoid disorders: Secondary | ICD-10-CM

## 2014-09-28 DIAGNOSIS — M9903 Segmental and somatic dysfunction of lumbar region: Secondary | ICD-10-CM | POA: Diagnosis not present

## 2014-09-28 DIAGNOSIS — M955 Acquired deformity of pelvis: Secondary | ICD-10-CM | POA: Diagnosis not present

## 2014-09-28 DIAGNOSIS — M9905 Segmental and somatic dysfunction of pelvic region: Secondary | ICD-10-CM | POA: Diagnosis not present

## 2014-09-28 DIAGNOSIS — M5136 Other intervertebral disc degeneration, lumbar region: Secondary | ICD-10-CM | POA: Diagnosis not present

## 2014-09-28 LAB — COMPREHENSIVE METABOLIC PANEL
ALBUMIN: 4 g/dL (ref 3.5–5.2)
ALT: 17 U/L (ref 0–35)
AST: 23 U/L (ref 0–37)
Alkaline Phosphatase: 65 U/L (ref 39–117)
BUN: 14 mg/dL (ref 6–23)
CO2: 30 mEq/L (ref 19–32)
Calcium: 9.6 mg/dL (ref 8.4–10.5)
Chloride: 104 mEq/L (ref 96–112)
Creatinine, Ser: 0.74 mg/dL (ref 0.40–1.20)
GFR: 82.27 mL/min (ref >60.00)
Glucose, Bld: 89 mg/dL (ref 70–99)
POTASSIUM: 4.1 meq/L (ref 3.5–5.1)
SODIUM: 137 meq/L (ref 135–145)
TOTAL PROTEIN: 6.7 g/dL (ref 6.0–8.3)
Total Bilirubin: 0.4 mg/dL (ref 0.2–1.2)

## 2014-09-28 LAB — FERRITIN: Ferritin: 59.7 ng/mL (ref 10.0–291.0)

## 2014-09-28 LAB — CBC WITH DIFFERENTIAL/PLATELET
Basophils Absolute: 0 10*3/uL (ref 0.0–0.1)
Basophils Relative: 0.4 % (ref 0.0–3.0)
Eosinophils Absolute: 0.3 10*3/uL (ref 0.0–0.7)
Eosinophils Relative: 5.1 % — ABNORMAL HIGH (ref 0.0–5.0)
HEMATOCRIT: 40.4 % (ref 36.0–46.0)
Hemoglobin: 13.7 g/dL (ref 12.0–15.0)
LYMPHS ABS: 2.5 10*3/uL (ref 0.7–4.0)
Lymphocytes Relative: 43 % (ref 12.0–46.0)
MCHC: 33.9 g/dL (ref 30.0–36.0)
MCV: 92.9 fl (ref 78.0–100.0)
MONO ABS: 0.4 10*3/uL (ref 0.1–1.0)
MONOS PCT: 5.9 % (ref 3.0–12.0)
NEUTROS ABS: 2.7 10*3/uL (ref 1.4–7.7)
Neutrophils Relative %: 45.6 % (ref 43.0–77.0)
Platelets: 224 10*3/uL (ref 150.0–400.0)
RBC: 4.35 Mil/uL (ref 3.87–5.11)
RDW: 12.4 % (ref 11.5–15.5)
WBC: 5.9 10*3/uL (ref 4.0–10.5)

## 2014-09-28 LAB — LIPID PANEL
Cholesterol: 203 mg/dL — ABNORMAL HIGH (ref 0–200)
HDL: 46.8 mg/dL (ref >39.00)
LDL Cholesterol: 133 mg/dL — ABNORMAL HIGH (ref 0–99)
NONHDL: 156.2
TRIGLYCERIDES: 118 mg/dL (ref 0.0–149.0)
Total CHOL/HDL Ratio: 4
VLDL: 23.6 mg/dL (ref 0.0–40.0)

## 2014-09-28 LAB — VITAMIN D 25 HYDROXY (VIT D DEFICIENCY, FRACTURES): VITD: 32.77 ng/mL (ref 30.00–100.00)

## 2014-09-28 LAB — TSH: TSH: 2.08 u[IU]/mL (ref 0.35–4.50)

## 2014-09-29 ENCOUNTER — Encounter: Payer: Self-pay | Admitting: *Deleted

## 2014-09-30 DIAGNOSIS — M9903 Segmental and somatic dysfunction of lumbar region: Secondary | ICD-10-CM | POA: Diagnosis not present

## 2014-09-30 DIAGNOSIS — M5136 Other intervertebral disc degeneration, lumbar region: Secondary | ICD-10-CM | POA: Diagnosis not present

## 2014-09-30 DIAGNOSIS — M955 Acquired deformity of pelvis: Secondary | ICD-10-CM | POA: Diagnosis not present

## 2014-09-30 DIAGNOSIS — M9904 Segmental and somatic dysfunction of sacral region: Secondary | ICD-10-CM | POA: Diagnosis not present

## 2014-09-30 DIAGNOSIS — M9905 Segmental and somatic dysfunction of pelvic region: Secondary | ICD-10-CM | POA: Diagnosis not present

## 2014-10-02 DIAGNOSIS — M5136 Other intervertebral disc degeneration, lumbar region: Secondary | ICD-10-CM | POA: Diagnosis not present

## 2014-10-02 DIAGNOSIS — M9903 Segmental and somatic dysfunction of lumbar region: Secondary | ICD-10-CM | POA: Diagnosis not present

## 2014-10-02 DIAGNOSIS — M9904 Segmental and somatic dysfunction of sacral region: Secondary | ICD-10-CM | POA: Diagnosis not present

## 2014-10-02 DIAGNOSIS — M9905 Segmental and somatic dysfunction of pelvic region: Secondary | ICD-10-CM | POA: Diagnosis not present

## 2014-10-02 DIAGNOSIS — M955 Acquired deformity of pelvis: Secondary | ICD-10-CM | POA: Diagnosis not present

## 2014-10-05 DIAGNOSIS — M955 Acquired deformity of pelvis: Secondary | ICD-10-CM | POA: Diagnosis not present

## 2014-10-05 DIAGNOSIS — M9903 Segmental and somatic dysfunction of lumbar region: Secondary | ICD-10-CM | POA: Diagnosis not present

## 2014-10-05 DIAGNOSIS — M9904 Segmental and somatic dysfunction of sacral region: Secondary | ICD-10-CM | POA: Diagnosis not present

## 2014-10-05 DIAGNOSIS — M9905 Segmental and somatic dysfunction of pelvic region: Secondary | ICD-10-CM | POA: Diagnosis not present

## 2014-10-05 DIAGNOSIS — M5136 Other intervertebral disc degeneration, lumbar region: Secondary | ICD-10-CM | POA: Diagnosis not present

## 2014-10-07 DIAGNOSIS — M9903 Segmental and somatic dysfunction of lumbar region: Secondary | ICD-10-CM | POA: Diagnosis not present

## 2014-10-07 DIAGNOSIS — M5136 Other intervertebral disc degeneration, lumbar region: Secondary | ICD-10-CM | POA: Diagnosis not present

## 2014-10-07 DIAGNOSIS — M9905 Segmental and somatic dysfunction of pelvic region: Secondary | ICD-10-CM | POA: Diagnosis not present

## 2014-10-07 DIAGNOSIS — M9904 Segmental and somatic dysfunction of sacral region: Secondary | ICD-10-CM | POA: Diagnosis not present

## 2014-10-07 DIAGNOSIS — M955 Acquired deformity of pelvis: Secondary | ICD-10-CM | POA: Diagnosis not present

## 2014-10-12 DIAGNOSIS — M9905 Segmental and somatic dysfunction of pelvic region: Secondary | ICD-10-CM | POA: Diagnosis not present

## 2014-10-12 DIAGNOSIS — M955 Acquired deformity of pelvis: Secondary | ICD-10-CM | POA: Diagnosis not present

## 2014-10-12 DIAGNOSIS — M9903 Segmental and somatic dysfunction of lumbar region: Secondary | ICD-10-CM | POA: Diagnosis not present

## 2014-10-12 DIAGNOSIS — M5136 Other intervertebral disc degeneration, lumbar region: Secondary | ICD-10-CM | POA: Diagnosis not present

## 2014-10-12 DIAGNOSIS — M9904 Segmental and somatic dysfunction of sacral region: Secondary | ICD-10-CM | POA: Diagnosis not present

## 2014-10-19 DIAGNOSIS — M5136 Other intervertebral disc degeneration, lumbar region: Secondary | ICD-10-CM | POA: Diagnosis not present

## 2014-10-19 DIAGNOSIS — M9905 Segmental and somatic dysfunction of pelvic region: Secondary | ICD-10-CM | POA: Diagnosis not present

## 2014-10-19 DIAGNOSIS — M955 Acquired deformity of pelvis: Secondary | ICD-10-CM | POA: Diagnosis not present

## 2014-10-19 DIAGNOSIS — M9904 Segmental and somatic dysfunction of sacral region: Secondary | ICD-10-CM | POA: Diagnosis not present

## 2014-10-19 DIAGNOSIS — M9903 Segmental and somatic dysfunction of lumbar region: Secondary | ICD-10-CM | POA: Diagnosis not present

## 2014-11-03 DIAGNOSIS — M955 Acquired deformity of pelvis: Secondary | ICD-10-CM | POA: Diagnosis not present

## 2014-11-03 DIAGNOSIS — M5136 Other intervertebral disc degeneration, lumbar region: Secondary | ICD-10-CM | POA: Diagnosis not present

## 2014-11-03 DIAGNOSIS — M9903 Segmental and somatic dysfunction of lumbar region: Secondary | ICD-10-CM | POA: Diagnosis not present

## 2014-11-03 DIAGNOSIS — M9904 Segmental and somatic dysfunction of sacral region: Secondary | ICD-10-CM | POA: Diagnosis not present

## 2014-11-03 DIAGNOSIS — M9905 Segmental and somatic dysfunction of pelvic region: Secondary | ICD-10-CM | POA: Diagnosis not present

## 2014-11-30 DIAGNOSIS — M9904 Segmental and somatic dysfunction of sacral region: Secondary | ICD-10-CM | POA: Diagnosis not present

## 2014-11-30 DIAGNOSIS — M9903 Segmental and somatic dysfunction of lumbar region: Secondary | ICD-10-CM | POA: Diagnosis not present

## 2014-11-30 DIAGNOSIS — M955 Acquired deformity of pelvis: Secondary | ICD-10-CM | POA: Diagnosis not present

## 2014-11-30 DIAGNOSIS — M5136 Other intervertebral disc degeneration, lumbar region: Secondary | ICD-10-CM | POA: Diagnosis not present

## 2014-11-30 DIAGNOSIS — M9905 Segmental and somatic dysfunction of pelvic region: Secondary | ICD-10-CM | POA: Diagnosis not present

## 2015-01-04 DIAGNOSIS — M5136 Other intervertebral disc degeneration, lumbar region: Secondary | ICD-10-CM | POA: Diagnosis not present

## 2015-01-04 DIAGNOSIS — M9903 Segmental and somatic dysfunction of lumbar region: Secondary | ICD-10-CM | POA: Diagnosis not present

## 2015-01-04 DIAGNOSIS — M9904 Segmental and somatic dysfunction of sacral region: Secondary | ICD-10-CM | POA: Diagnosis not present

## 2015-01-04 DIAGNOSIS — M955 Acquired deformity of pelvis: Secondary | ICD-10-CM | POA: Diagnosis not present

## 2015-01-04 DIAGNOSIS — M9905 Segmental and somatic dysfunction of pelvic region: Secondary | ICD-10-CM | POA: Diagnosis not present

## 2015-02-03 DIAGNOSIS — M9903 Segmental and somatic dysfunction of lumbar region: Secondary | ICD-10-CM | POA: Diagnosis not present

## 2015-02-03 DIAGNOSIS — M955 Acquired deformity of pelvis: Secondary | ICD-10-CM | POA: Diagnosis not present

## 2015-02-03 DIAGNOSIS — M5136 Other intervertebral disc degeneration, lumbar region: Secondary | ICD-10-CM | POA: Diagnosis not present

## 2015-02-03 DIAGNOSIS — M9904 Segmental and somatic dysfunction of sacral region: Secondary | ICD-10-CM | POA: Diagnosis not present

## 2015-02-03 DIAGNOSIS — M9905 Segmental and somatic dysfunction of pelvic region: Secondary | ICD-10-CM | POA: Diagnosis not present

## 2015-03-17 DIAGNOSIS — M5136 Other intervertebral disc degeneration, lumbar region: Secondary | ICD-10-CM | POA: Diagnosis not present

## 2015-03-17 DIAGNOSIS — M9903 Segmental and somatic dysfunction of lumbar region: Secondary | ICD-10-CM | POA: Diagnosis not present

## 2015-03-17 DIAGNOSIS — M9905 Segmental and somatic dysfunction of pelvic region: Secondary | ICD-10-CM | POA: Diagnosis not present

## 2015-03-17 DIAGNOSIS — M9904 Segmental and somatic dysfunction of sacral region: Secondary | ICD-10-CM | POA: Diagnosis not present

## 2015-03-17 DIAGNOSIS — M955 Acquired deformity of pelvis: Secondary | ICD-10-CM | POA: Diagnosis not present

## 2015-04-01 ENCOUNTER — Other Ambulatory Visit: Payer: Self-pay

## 2015-04-01 MED ORDER — ALENDRONATE SODIUM 70 MG PO TABS: 70.0000 mg | ORAL_TABLET | ORAL | Status: DC

## 2015-04-01 NOTE — Telephone Encounter (Signed)
Please advise refill?  Last OV was in January of 2016.

## 2015-04-01 NOTE — Telephone Encounter (Signed)
Refilled fosamax #4 with 5 refills.

## 2015-04-14 DIAGNOSIS — M955 Acquired deformity of pelvis: Secondary | ICD-10-CM | POA: Diagnosis not present

## 2015-04-14 DIAGNOSIS — M9903 Segmental and somatic dysfunction of lumbar region: Secondary | ICD-10-CM | POA: Diagnosis not present

## 2015-04-14 DIAGNOSIS — M9905 Segmental and somatic dysfunction of pelvic region: Secondary | ICD-10-CM | POA: Diagnosis not present

## 2015-04-14 DIAGNOSIS — M9904 Segmental and somatic dysfunction of sacral region: Secondary | ICD-10-CM | POA: Diagnosis not present

## 2015-04-14 DIAGNOSIS — M5136 Other intervertebral disc degeneration, lumbar region: Secondary | ICD-10-CM | POA: Diagnosis not present

## 2015-05-12 DIAGNOSIS — M5136 Other intervertebral disc degeneration, lumbar region: Secondary | ICD-10-CM | POA: Diagnosis not present

## 2015-05-12 DIAGNOSIS — M9905 Segmental and somatic dysfunction of pelvic region: Secondary | ICD-10-CM | POA: Diagnosis not present

## 2015-05-12 DIAGNOSIS — M9903 Segmental and somatic dysfunction of lumbar region: Secondary | ICD-10-CM | POA: Diagnosis not present

## 2015-05-12 DIAGNOSIS — M9904 Segmental and somatic dysfunction of sacral region: Secondary | ICD-10-CM | POA: Diagnosis not present

## 2015-05-12 DIAGNOSIS — M955 Acquired deformity of pelvis: Secondary | ICD-10-CM | POA: Diagnosis not present

## 2015-05-17 DIAGNOSIS — Z961 Presence of intraocular lens: Secondary | ICD-10-CM | POA: Diagnosis not present

## 2015-06-22 DIAGNOSIS — M9905 Segmental and somatic dysfunction of pelvic region: Secondary | ICD-10-CM | POA: Diagnosis not present

## 2015-06-22 DIAGNOSIS — M5136 Other intervertebral disc degeneration, lumbar region: Secondary | ICD-10-CM | POA: Diagnosis not present

## 2015-06-22 DIAGNOSIS — M9903 Segmental and somatic dysfunction of lumbar region: Secondary | ICD-10-CM | POA: Diagnosis not present

## 2015-06-22 DIAGNOSIS — M955 Acquired deformity of pelvis: Secondary | ICD-10-CM | POA: Diagnosis not present

## 2015-06-28 ENCOUNTER — Ambulatory Visit (INDEPENDENT_AMBULATORY_CARE_PROVIDER_SITE_OTHER): Payer: PPO | Admitting: Internal Medicine

## 2015-06-28 ENCOUNTER — Encounter: Payer: Self-pay | Admitting: Internal Medicine

## 2015-06-28 ENCOUNTER — Encounter: Payer: Self-pay | Admitting: *Deleted

## 2015-06-28 VITALS — BP 120/70 | HR 65 | Temp 98.4°F | Resp 18 | Ht 65.5 in | Wt 126.5 lb

## 2015-06-28 DIAGNOSIS — D649 Anemia, unspecified: Secondary | ICD-10-CM | POA: Diagnosis not present

## 2015-06-28 DIAGNOSIS — M810 Age-related osteoporosis without current pathological fracture: Secondary | ICD-10-CM

## 2015-06-28 DIAGNOSIS — Z1239 Encounter for other screening for malignant neoplasm of breast: Secondary | ICD-10-CM | POA: Diagnosis not present

## 2015-06-28 DIAGNOSIS — Z13220 Encounter for screening for lipoid disorders: Secondary | ICD-10-CM

## 2015-06-28 DIAGNOSIS — Z0000 Encounter for general adult medical examination without abnormal findings: Secondary | ICD-10-CM | POA: Diagnosis not present

## 2015-06-28 DIAGNOSIS — Z23 Encounter for immunization: Secondary | ICD-10-CM | POA: Diagnosis not present

## 2015-06-28 DIAGNOSIS — M25552 Pain in left hip: Secondary | ICD-10-CM

## 2015-06-28 LAB — CBC WITH DIFFERENTIAL/PLATELET
BASOS PCT: 0.6 % (ref 0.0–3.0)
Basophils Absolute: 0 10*3/uL (ref 0.0–0.1)
EOS PCT: 4.2 % (ref 0.0–5.0)
Eosinophils Absolute: 0.2 10*3/uL (ref 0.0–0.7)
HEMATOCRIT: 41.2 % (ref 36.0–46.0)
Hemoglobin: 13.5 g/dL (ref 12.0–15.0)
LYMPHS ABS: 2.7 10*3/uL (ref 0.7–4.0)
LYMPHS PCT: 45.6 % (ref 12.0–46.0)
MCHC: 32.8 g/dL (ref 30.0–36.0)
MCV: 94.3 fl (ref 78.0–100.0)
MONOS PCT: 8.4 % (ref 3.0–12.0)
Monocytes Absolute: 0.5 10*3/uL (ref 0.1–1.0)
NEUTROS ABS: 2.4 10*3/uL (ref 1.4–7.7)
NEUTROS PCT: 41.2 % — AB (ref 43.0–77.0)
PLATELETS: 226 10*3/uL (ref 150.0–400.0)
RBC: 4.37 Mil/uL (ref 3.87–5.11)
RDW: 13 % (ref 11.5–15.5)
WBC: 5.9 10*3/uL (ref 4.0–10.5)

## 2015-06-28 LAB — COMPREHENSIVE METABOLIC PANEL
ALT: 22 U/L (ref 0–35)
AST: 26 U/L (ref 0–37)
Albumin: 3.6 g/dL (ref 3.5–5.2)
Alkaline Phosphatase: 70 U/L (ref 39–117)
BILIRUBIN TOTAL: 0.4 mg/dL (ref 0.2–1.2)
BUN: 10 mg/dL (ref 6–23)
CALCIUM: 8.7 mg/dL (ref 8.4–10.5)
CHLORIDE: 104 meq/L (ref 96–112)
CO2: 30 meq/L (ref 19–32)
Creatinine, Ser: 0.69 mg/dL (ref 0.40–1.20)
GFR: 89 mL/min (ref >60.00)
Glucose, Bld: 98 mg/dL (ref 70–99)
POTASSIUM: 4 meq/L (ref 3.5–5.1)
Sodium: 140 mEq/L (ref 135–145)
Total Protein: 5.9 g/dL — ABNORMAL LOW (ref 6.0–8.3)

## 2015-06-28 LAB — LIPID PANEL
CHOLESTEROL: 178 mg/dL (ref 0–200)
HDL: 33.5 mg/dL — AB (ref >39.00)
LDL CALC: 130 mg/dL — AB (ref 0–99)
NonHDL: 144.56
TRIGLYCERIDES: 74 mg/dL (ref 0.0–149.0)
Total CHOL/HDL Ratio: 5
VLDL: 14.8 mg/dL (ref 0.0–40.0)

## 2015-06-28 LAB — FERRITIN: Ferritin: 68.3 ng/mL (ref 10.0–291.0)

## 2015-06-28 LAB — TSH: TSH: 2.37 u[IU]/mL (ref 0.35–4.50)

## 2015-06-28 NOTE — Assessment & Plan Note (Signed)
Physical today 06/28/15.  PAP 06/24/12 - negative with negative HPV.  Colonoscopy 05/2013 - recommended f/u in five years.  Due mammogram.  Scheduled.  Bone density - 2014 improved.

## 2015-06-28 NOTE — Assessment & Plan Note (Signed)
Weight bearing exercise.  Follow vitamin D level.  On fosamax.  Plan for f/u bone density within the next year.  Last improved.

## 2015-06-28 NOTE — Progress Notes (Signed)
Patient ID: Sonya Hernandez, female   DOB: Apr 26, 1946, 70 y.o.   MRN: [id number]   Subjective:    Patient ID: Sonya Hernandez, female    DOB: 1945/10/15, 70 y.o.   MRN: [id number]  HPI  Patient with past history of osteoporosis and documented anemia who comes in today to follow up on these issues as well as for a complete physical exam.  She was having problems with her left hip.  Seeing a chiropractor (Dr Joyce Copa).  Is better.  Some right knee discomfort.  Gave out on her and she fell.  This has been weeks ago.  No falls since.  Knee is doing better.  Desires no further w/up at this time.  She is walking.  No cardiac symptoms with increased activity or exertion.  No sob.  No acid reflux.  No abdominal pain or cramping.  Bowels stable.  Last bone density improved.  Taking fosamax.     Past Medical History  Diagnosis Date  . Anemia   . Osteoporosis    Past Surgical History  Procedure Laterality Date  . No past surgeries     Family History  Problem Relation Age of Onset  . Diabetes Mother   . Thyroid disease Mother     Questionable  . Colon cancer Mother     Questionable  . Heart attack Father   . Deep vein thrombosis Father   . Diabetes Sister   . Breast cancer Neg Hx    Social History   Social History  . Marital Status: Married    Spouse Name: N/A  . Number of Children: 3  . Years of Education: N/A   Social History Main Topics  . Smoking status: Never Smoker   . Smokeless tobacco: Never Used  . Alcohol Use: No  . Drug Use: No  . Sexual Activity: Not Asked   Other Topics Concern  . None   Social History Narrative   She is married and has 3 daughters.    Outpatient Encounter Prescriptions as of 06/28/2015  Medication Sig  . alendronate (FOSAMAX) 70 MG tablet Take 1 tablet (70 mg total) by mouth every 7 (seven) days. Take with a full glass of water on an empty stomach.  . Calcium Carbonate-Vitamin D 600-400 MG-UNIT per chew tablet Chew 1 tablet by mouth 2 (two)  times daily.   . Cholecalciferol 1000 UNITS capsule Take 1,000 Units by mouth daily.  . clobetasol cream (TEMOVATE) AB-123456789 % Apply 1 application topically 2 (two) times a week.  . estradiol (ESTRACE) 0.1 MG/GM vaginal cream Place 2 g vaginally 3 (three) times a week.   . ferrous sulfate 325 (65 FE) MG EC tablet Take 325 mg by mouth every other day.    No facility-administered encounter medications on file as of 06/28/2015.    Review of Systems  Constitutional: Negative for appetite change and unexpected weight change.  HENT: Negative for congestion and sinus pressure.   Eyes: Negative for pain and visual disturbance.  Respiratory: Negative for cough, chest tightness and shortness of breath.   Cardiovascular: Negative for chest pain, palpitations and leg swelling.  Gastrointestinal: Negative for nausea, vomiting, abdominal pain and diarrhea.  Genitourinary: Negative for dysuria and difficulty urinating.  Musculoskeletal: Negative for joint swelling.       Left hip pain and right knee pain as outlined.   Better.   Skin: Negative for color change and rash.  Neurological: Negative for dizziness, light-headedness and headaches.  Hematological: Negative for adenopathy.  Does not bruise/bleed easily.  Psychiatric/Behavioral: Negative for dysphoric mood and agitation.       Objective:     Blood pressure rechecked by me:  122/78  Physical Exam  Constitutional: She is oriented to person, place, and time. She appears well-developed and well-nourished. No distress.  HENT:  Nose: Nose normal.  Mouth/Throat: Oropharynx is clear and moist.  Eyes: Right eye exhibits no discharge. Left eye exhibits no discharge. No scleral icterus.  Neck: Neck supple. No thyromegaly present.  Cardiovascular: Normal rate and regular rhythm.   Pulmonary/Chest: Breath sounds normal. No accessory muscle usage. No tachypnea. No respiratory distress. She has no decreased breath sounds. She has no wheezes. She has no  rhonchi. Right breast exhibits no inverted nipple, no mass, no nipple discharge and no tenderness (no axillary adenopathy). Left breast exhibits no inverted nipple, no mass, no nipple discharge and no tenderness (no axilarry adenopathy).  Abdominal: Soft. Bowel sounds are normal. There is no tenderness.  Musculoskeletal: She exhibits no edema or tenderness.  Lymphadenopathy:    She has no cervical adenopathy.  Neurological: She is alert and oriented to person, place, and time.  Skin: Skin is warm. No rash noted. No erythema.  Psychiatric: She has a normal mood and affect. Her behavior is normal.    BP 120/70 mmHg  Pulse 65  Temp(Src) 98.4 F (36.9 C) (Oral)  Resp 18  Ht 5' 5.5" (1.664 m)  Wt 126 lb 8 oz (57.38 kg)  BMI 20.72 kg/m2  SpO2 97%  LMP 06/24/1993 Wt Readings from Last 3 Encounters:  06/28/15 126 lb 8 oz (57.38 kg)  06/25/14 125 lb 4 oz (56.813 kg)  12/23/13 127 lb 12 oz (57.947 kg)     Lab Results  Component Value Date   WBC 5.9 06/28/2015   HGB 13.5 06/28/2015   HCT 41.2 06/28/2015   PLT 226.0 06/28/2015   GLUCOSE 98 06/28/2015   CHOL 178 06/28/2015   TRIG 74.0 06/28/2015   HDL 33.50* 06/28/2015   LDLDIRECT 157.7 06/24/2012   LDLCALC 130* 06/28/2015   ALT 22 06/28/2015   AST 26 06/28/2015   NA 140 06/28/2015   K 4.0 06/28/2015   CL 104 06/28/2015   CREATININE 0.69 06/28/2015   BUN 10 06/28/2015   CO2 30 06/28/2015   TSH 2.37 06/28/2015       Assessment & Plan:   Problem List Items Addressed This Visit    Anemia    Taking iron qod.  Follow cbc and ferritin.        Relevant Orders   CBC with Differential/Platelet (Completed)   Ferritin (Completed)   Health care maintenance    Physical today 06/28/15.  PAP 06/24/12 - negative with negative HPV.  Colonoscopy 05/2013 - recommended f/u in five years.  Due mammogram.  Scheduled.  Bone density - 2014 improved.        Left hip pain    Seeing a chiropractor.  Right knee pain better.  Continue exercise  as she is doing.  Desires no further w/up at this time.  Follow.        Osteoporosis    Weight bearing exercise.  Follow vitamin D level.  On fosamax.  Plan for f/u bone density within the next year.  Last improved.       Relevant Orders   Comprehensive metabolic panel (Completed)   TSH (Completed)    Other Visit Diagnoses    Breast cancer screening    -  Primary  Relevant Orders    MM DIGITAL SCREENING BILATERAL    Encounter for immunization        Screening cholesterol level        Relevant Orders    Lipid panel (Completed)        Einar Pheasant, MD

## 2015-06-28 NOTE — Progress Notes (Signed)
Pre-visit discussion using our clinic review tool. No additional management support is needed unless otherwise documented below in the visit note.  

## 2015-06-29 ENCOUNTER — Encounter: Payer: Self-pay | Admitting: Internal Medicine

## 2015-06-29 DIAGNOSIS — M25552 Pain in left hip: Secondary | ICD-10-CM | POA: Insufficient documentation

## 2015-06-29 NOTE — Assessment & Plan Note (Signed)
Taking iron qod.  Follow cbc and ferritin.

## 2015-06-29 NOTE — Assessment & Plan Note (Signed)
Seeing a Restaurant manager, [employer].  Right knee pain better.  Continue exercise as she is doing.  Desires no further w/up at this time.  Follow.

## 2015-07-06 ENCOUNTER — Ambulatory Visit
Admission: RE | Admit: 2015-07-06 | Discharge: 2015-07-06 | Disposition: A | Discharge status: Home / Self Care | Payer: PPO | Source: Ambulatory Visit | Attending: Internal Medicine | Admitting: Internal Medicine

## 2015-07-06 DIAGNOSIS — Z1239 Encounter for other screening for malignant neoplasm of breast: Secondary | ICD-10-CM

## 2015-07-07 ENCOUNTER — Ambulatory Visit
Admission: RE | Admit: 2015-07-07 | Discharge: 2015-07-07 | Disposition: A | Discharge status: Home / Self Care | Payer: PPO | Source: Ambulatory Visit | Attending: Internal Medicine | Admitting: Internal Medicine

## 2015-07-07 ENCOUNTER — Other Ambulatory Visit: Payer: Self-pay | Admitting: Internal Medicine

## 2015-07-07 DIAGNOSIS — Z1231 Encounter for screening mammogram for malignant neoplasm of breast: Secondary | ICD-10-CM | POA: Insufficient documentation

## 2015-07-07 DIAGNOSIS — Z1239 Encounter for other screening for malignant neoplasm of breast: Secondary | ICD-10-CM

## 2015-08-04 DIAGNOSIS — M9905 Segmental and somatic dysfunction of pelvic region: Secondary | ICD-10-CM | POA: Diagnosis not present

## 2015-08-04 DIAGNOSIS — M5136 Other intervertebral disc degeneration, lumbar region: Secondary | ICD-10-CM | POA: Diagnosis not present

## 2015-08-04 DIAGNOSIS — M9903 Segmental and somatic dysfunction of lumbar region: Secondary | ICD-10-CM | POA: Diagnosis not present

## 2015-08-04 DIAGNOSIS — M955 Acquired deformity of pelvis: Secondary | ICD-10-CM | POA: Diagnosis not present

## 2015-09-01 DIAGNOSIS — M5136 Other intervertebral disc degeneration, lumbar region: Secondary | ICD-10-CM | POA: Diagnosis not present

## 2015-09-01 DIAGNOSIS — M955 Acquired deformity of pelvis: Secondary | ICD-10-CM | POA: Diagnosis not present

## 2015-09-01 DIAGNOSIS — M9903 Segmental and somatic dysfunction of lumbar region: Secondary | ICD-10-CM | POA: Diagnosis not present

## 2015-09-01 DIAGNOSIS — M9905 Segmental and somatic dysfunction of pelvic region: Secondary | ICD-10-CM | POA: Diagnosis not present

## 2015-10-06 DIAGNOSIS — M955 Acquired deformity of pelvis: Secondary | ICD-10-CM | POA: Diagnosis not present

## 2015-10-06 DIAGNOSIS — M9905 Segmental and somatic dysfunction of pelvic region: Secondary | ICD-10-CM | POA: Diagnosis not present

## 2015-10-06 DIAGNOSIS — M5136 Other intervertebral disc degeneration, lumbar region: Secondary | ICD-10-CM | POA: Diagnosis not present

## 2015-10-06 DIAGNOSIS — M9903 Segmental and somatic dysfunction of lumbar region: Secondary | ICD-10-CM | POA: Diagnosis not present

## 2015-10-14 ENCOUNTER — Other Ambulatory Visit: Payer: Self-pay | Admitting: Internal Medicine

## 2015-11-10 DIAGNOSIS — M9903 Segmental and somatic dysfunction of lumbar region: Secondary | ICD-10-CM | POA: Diagnosis not present

## 2015-11-10 DIAGNOSIS — M955 Acquired deformity of pelvis: Secondary | ICD-10-CM | POA: Diagnosis not present

## 2015-11-10 DIAGNOSIS — M5136 Other intervertebral disc degeneration, lumbar region: Secondary | ICD-10-CM | POA: Diagnosis not present

## 2015-11-10 DIAGNOSIS — M9905 Segmental and somatic dysfunction of pelvic region: Secondary | ICD-10-CM | POA: Diagnosis not present

## 2015-12-15 DIAGNOSIS — M955 Acquired deformity of pelvis: Secondary | ICD-10-CM | POA: Diagnosis not present

## 2015-12-15 DIAGNOSIS — M9903 Segmental and somatic dysfunction of lumbar region: Secondary | ICD-10-CM | POA: Diagnosis not present

## 2015-12-15 DIAGNOSIS — M5136 Other intervertebral disc degeneration, lumbar region: Secondary | ICD-10-CM | POA: Diagnosis not present

## 2015-12-15 DIAGNOSIS — M9905 Segmental and somatic dysfunction of pelvic region: Secondary | ICD-10-CM | POA: Diagnosis not present

## 2016-01-12 DIAGNOSIS — M9903 Segmental and somatic dysfunction of lumbar region: Secondary | ICD-10-CM | POA: Diagnosis not present

## 2016-01-12 DIAGNOSIS — M9905 Segmental and somatic dysfunction of pelvic region: Secondary | ICD-10-CM | POA: Diagnosis not present

## 2016-01-12 DIAGNOSIS — M955 Acquired deformity of pelvis: Secondary | ICD-10-CM | POA: Diagnosis not present

## 2016-01-12 DIAGNOSIS — M5136 Other intervertebral disc degeneration, lumbar region: Secondary | ICD-10-CM | POA: Diagnosis not present

## 2016-02-16 DIAGNOSIS — M5136 Other intervertebral disc degeneration, lumbar region: Secondary | ICD-10-CM | POA: Diagnosis not present

## 2016-02-16 DIAGNOSIS — M9903 Segmental and somatic dysfunction of lumbar region: Secondary | ICD-10-CM | POA: Diagnosis not present

## 2016-02-16 DIAGNOSIS — M955 Acquired deformity of pelvis: Secondary | ICD-10-CM | POA: Diagnosis not present

## 2016-02-16 DIAGNOSIS — M9905 Segmental and somatic dysfunction of pelvic region: Secondary | ICD-10-CM | POA: Diagnosis not present

## 2016-03-22 DIAGNOSIS — M955 Acquired deformity of pelvis: Secondary | ICD-10-CM | POA: Diagnosis not present

## 2016-03-22 DIAGNOSIS — M5136 Other intervertebral disc degeneration, lumbar region: Secondary | ICD-10-CM | POA: Diagnosis not present

## 2016-03-22 DIAGNOSIS — M9905 Segmental and somatic dysfunction of pelvic region: Secondary | ICD-10-CM | POA: Diagnosis not present

## 2016-03-22 DIAGNOSIS — M9903 Segmental and somatic dysfunction of lumbar region: Secondary | ICD-10-CM | POA: Diagnosis not present

## 2016-05-01 ENCOUNTER — Other Ambulatory Visit: Payer: Self-pay | Admitting: Internal Medicine

## 2016-05-03 DIAGNOSIS — M9905 Segmental and somatic dysfunction of pelvic region: Secondary | ICD-10-CM | POA: Diagnosis not present

## 2016-05-03 DIAGNOSIS — M9903 Segmental and somatic dysfunction of lumbar region: Secondary | ICD-10-CM | POA: Diagnosis not present

## 2016-05-03 DIAGNOSIS — M955 Acquired deformity of pelvis: Secondary | ICD-10-CM | POA: Diagnosis not present

## 2016-05-03 DIAGNOSIS — M5136 Other intervertebral disc degeneration, lumbar region: Secondary | ICD-10-CM | POA: Diagnosis not present

## 2016-05-16 DIAGNOSIS — Z961 Presence of intraocular lens: Secondary | ICD-10-CM | POA: Diagnosis not present

## 2016-05-16 DIAGNOSIS — H26493 Other secondary cataract, bilateral: Secondary | ICD-10-CM | POA: Diagnosis not present

## 2016-06-14 ENCOUNTER — Other Ambulatory Visit: Payer: Self-pay | Admitting: Internal Medicine

## 2016-06-14 DIAGNOSIS — M9905 Segmental and somatic dysfunction of pelvic region: Secondary | ICD-10-CM | POA: Diagnosis not present

## 2016-06-14 DIAGNOSIS — M9903 Segmental and somatic dysfunction of lumbar region: Secondary | ICD-10-CM | POA: Diagnosis not present

## 2016-06-14 DIAGNOSIS — M5136 Other intervertebral disc degeneration, lumbar region: Secondary | ICD-10-CM | POA: Diagnosis not present

## 2016-06-14 DIAGNOSIS — M955 Acquired deformity of pelvis: Secondary | ICD-10-CM | POA: Diagnosis not present

## 2016-06-23 ENCOUNTER — Other Ambulatory Visit: Payer: Self-pay

## 2016-06-23 MED ORDER — ALENDRONATE SODIUM 70 MG PO TABS: ORAL_TABLET | ORAL | 0 refills | Status: DC

## 2016-06-23 NOTE — Telephone Encounter (Signed)
Medication has been refilled.

## 2016-06-28 ENCOUNTER — Encounter: Payer: Self-pay | Admitting: Internal Medicine

## 2016-06-28 ENCOUNTER — Ambulatory Visit (INDEPENDENT_AMBULATORY_CARE_PROVIDER_SITE_OTHER): Payer: PPO | Admitting: Internal Medicine

## 2016-06-28 VITALS — BP 116/74 | HR 60 | Temp 97.8°F | Resp 16 | Ht 64.75 in | Wt 123.4 lb

## 2016-06-28 DIAGNOSIS — M25552 Pain in left hip: Secondary | ICD-10-CM

## 2016-06-28 DIAGNOSIS — D649 Anemia, unspecified: Secondary | ICD-10-CM | POA: Diagnosis not present

## 2016-06-28 DIAGNOSIS — M810 Age-related osteoporosis without current pathological fracture: Secondary | ICD-10-CM | POA: Diagnosis not present

## 2016-06-28 DIAGNOSIS — Z1239 Encounter for other screening for malignant neoplasm of breast: Secondary | ICD-10-CM

## 2016-06-28 DIAGNOSIS — Z0000 Encounter for general adult medical examination without abnormal findings: Secondary | ICD-10-CM

## 2016-06-28 DIAGNOSIS — Z1231 Encounter for screening mammogram for malignant neoplasm of breast: Secondary | ICD-10-CM | POA: Diagnosis not present

## 2016-06-28 DIAGNOSIS — Z23 Encounter for immunization: Secondary | ICD-10-CM

## 2016-06-28 DIAGNOSIS — E2839 Other primary ovarian failure: Secondary | ICD-10-CM

## 2016-06-28 LAB — CBC WITH DIFFERENTIAL/PLATELET
Basophils Absolute: 0 10*3/uL (ref 0.0–0.1)
Basophils Relative: 0.3 % (ref 0.0–3.0)
Eosinophils Absolute: 0.3 10*3/uL (ref 0.0–0.7)
Eosinophils Relative: 4.6 % (ref 0.0–5.0)
HEMATOCRIT: 38.9 % (ref 36.0–46.0)
HEMOGLOBIN: 13 g/dL (ref 12.0–15.0)
Lymphocytes Relative: 46.5 % — ABNORMAL HIGH (ref 12.0–46.0)
Lymphs Abs: 2.7 10*3/uL (ref 0.7–4.0)
MCHC: 33.3 g/dL (ref 30.0–36.0)
MCV: 94 fl (ref 78.0–100.0)
MONOS PCT: 6.8 % (ref 3.0–12.0)
Monocytes Absolute: 0.4 10*3/uL (ref 0.1–1.0)
Neutro Abs: 2.4 10*3/uL (ref 1.4–7.7)
Neutrophils Relative %: 41.8 % — ABNORMAL LOW (ref 43.0–77.0)
Platelets: 221 10*3/uL (ref 150.0–400.0)
RBC: 4.14 Mil/uL (ref 3.87–5.11)
RDW: 13.3 % (ref 11.5–15.5)
WBC: 5.8 10*3/uL (ref 4.0–10.5)

## 2016-06-28 LAB — COMPREHENSIVE METABOLIC PANEL
ALBUMIN: 3.7 g/dL (ref 3.5–5.2)
ALK PHOS: 66 U/L (ref 39–117)
ALT: 19 U/L (ref 0–35)
AST: 23 U/L (ref 0–37)
BUN: 12 mg/dL (ref 6–23)
CO2: 31 mEq/L (ref 19–32)
Calcium: 8.8 mg/dL (ref 8.4–10.5)
Chloride: 107 mEq/L (ref 96–112)
Creatinine, Ser: 0.76 mg/dL (ref 0.40–1.20)
GFR: 79.38 mL/min (ref >60.00)
Glucose, Bld: 96 mg/dL (ref 70–99)
POTASSIUM: 4.4 meq/L (ref 3.5–5.1)
Sodium: 141 mEq/L (ref 135–145)
TOTAL PROTEIN: 5.9 g/dL — AB (ref 6.0–8.3)
Total Bilirubin: 0.4 mg/dL (ref 0.2–1.2)

## 2016-06-28 LAB — LIPID PANEL
CHOLESTEROL: 200 mg/dL (ref 0–200)
HDL: 50.2 mg/dL (ref >39.00)
LDL Cholesterol: 137 mg/dL — ABNORMAL HIGH (ref 0–99)
NonHDL: 149.99
Total CHOL/HDL Ratio: 4
Triglycerides: 66 mg/dL (ref 0.0–149.0)
VLDL: 13.2 mg/dL (ref 0.0–40.0)

## 2016-06-28 LAB — VITAMIN D 25 HYDROXY (VIT D DEFICIENCY, FRACTURES): VITD: 30.73 ng/mL (ref 30.00–100.00)

## 2016-06-28 LAB — FERRITIN: FERRITIN: 55.1 ng/mL (ref 10.0–291.0)

## 2016-06-28 LAB — TSH: TSH: 2.78 u[IU]/mL (ref 0.35–4.50)

## 2016-06-28 NOTE — Assessment & Plan Note (Signed)
On iron.  Check cbc and ferritin.   

## 2016-06-28 NOTE — Assessment & Plan Note (Signed)
On fosamax.  Recheck vitamin D level today.  Recheck bone density.  Last 2014.

## 2016-06-28 NOTE — Assessment & Plan Note (Signed)
Better.  Sees a chiropractor q 6 months.  Stays active.

## 2016-06-28 NOTE — Progress Notes (Signed)
Patient ID: Sonya Hernandez, female   DOB: 04/19/1946, 71 y.o.   MRN: [id number]   Subjective:    Patient ID: Sonya Hernandez, female    DOB: Dec 13, 1945, 71 y.o.   MRN: [id number]  HPI  Patient with past history of osteoporosis and anemia.  She comes in today to follow up on these issues as well as for a complete physical exam.  She is doing well.  Stays active.  Exercises regularly.  No chest pain.  No sob.  No acid reflux.  No abdominal pain or cramping.  Bowels stable.  No significant back, hip or knee pain.  Doing better.  Sees Dr Joyce Copa every 6 months.     Past Medical History:  Diagnosis Date  . Anemia   . Osteoporosis    Past Surgical History:  Procedure Laterality Date  . NO PAST SURGERIES     Family History  Problem Relation Age of Onset  . Diabetes Mother   . Thyroid disease Mother     Questionable  . Colon cancer Mother     Questionable  . Heart attack Father   . Deep vein thrombosis Father   . Diabetes Sister   . Breast cancer Neg Hx    Social History   Social History  . Marital status: Married    Spouse name: N/A  . Number of children: 3  . Years of education: N/A   Social History Main Topics  . Smoking status: Never Smoker  . Smokeless tobacco: Never Used  . Alcohol use No  . Drug use: No  . Sexual activity: Not Asked   Other Topics Concern  . None   Social History Narrative   She is married and has 3 daughters.    Outpatient Encounter Prescriptions as of 06/28/2016  Medication Sig  . alendronate (FOSAMAX) 70 MG tablet Take 1 tablet (70 mg total) by mouth every 7 (seven) days. Take with a full glass of water on an empty stomach.  . Calcium Carbonate-Vitamin D 600-400 MG-UNIT per chew tablet Chew 1 tablet by mouth 2 (two) times daily.   . Cholecalciferol 1000 UNITS capsule Take 1,000 Units by mouth daily.  . clobetasol cream (TEMOVATE) [id number] % Apply 1 application topically 2 (two) times a week.  . estradiol (ESTRACE) 0.1 MG/GM vaginal cream  Place 2 g vaginally 3 (three) times a week.   . ferrous sulfate 325 (65 FE) MG EC tablet Take 325 mg by mouth every other day.    No facility-administered encounter medications on file as of 06/28/2016.     Review of Systems  Constitutional: Negative for appetite change and unexpected weight change.  HENT: Negative for congestion and sinus pressure.   Eyes: Negative for pain and visual disturbance.  Respiratory: Negative for cough, chest tightness and shortness of breath.   Cardiovascular: Negative for chest pain, palpitations and leg swelling.  Gastrointestinal: Negative for abdominal pain, diarrhea, nausea and vomiting.  Genitourinary: Negative for difficulty urinating and dysuria.  Musculoskeletal: Negative for back pain and joint swelling.  Skin: Negative for color change and rash.  Neurological: Negative for dizziness, light-headedness and headaches.  Hematological: Negative for adenopathy. Does not bruise/bleed easily.  Psychiatric/Behavioral: Negative for agitation and dysphoric mood.       Objective:    Physical Exam  Constitutional: She is oriented to person, place, and time. She appears well-developed and well-nourished. No distress.  HENT:  Nose: Nose normal.  Mouth/Throat: Oropharynx is clear and moist.  Eyes: Right eye  exhibits no discharge. Left eye exhibits no discharge. No scleral icterus.  Neck: Neck supple. No thyromegaly present.  Cardiovascular: Normal rate and regular rhythm.   Pulmonary/Chest: Breath sounds normal. No accessory muscle usage. No tachypnea. No respiratory distress. She has no decreased breath sounds. She has no wheezes. She has no rhonchi. Right breast exhibits no inverted nipple, no mass, no nipple discharge and no tenderness (no axillary adenopathy). Left breast exhibits no inverted nipple, no mass, no nipple discharge and no tenderness (no axilarry adenopathy).  Abdominal: Soft. Bowel sounds are normal. There is no tenderness.  Musculoskeletal:  She exhibits no edema or tenderness.  Lymphadenopathy:    She has no cervical adenopathy.  Neurological: She is alert and oriented to person, place, and time.  Skin: Skin is warm. No rash noted. No erythema.  Psychiatric: She has a normal mood and affect. Her behavior is normal.    BP 116/74 (BP Location: Left Arm, Patient Position: Sitting, Cuff Size: Normal)   Pulse 60   Temp 97.8 F (36.6 C) (Oral)   Resp 16   Ht 5' 4.75" (1.645 m)   Wt 123 lb 6 oz (56 kg)   LMP 06/24/1993   SpO2 98%   BMI 20.69 kg/m  Wt Readings from Last 3 Encounters:  06/28/16 123 lb 6 oz (56 kg)  06/28/15 126 lb 8 oz (57.4 kg)  06/25/14 125 lb 4 oz (56.8 kg)     Lab Results  Component Value Date   WBC 5.9 06/28/2015   HGB 13.5 06/28/2015   HCT 41.2 06/28/2015   PLT 226.0 06/28/2015   GLUCOSE 98 06/28/2015   CHOL 178 06/28/2015   TRIG 74.0 06/28/2015   HDL 33.50 (L) 06/28/2015   LDLDIRECT 157.7 06/24/2012   LDLCALC 130 (H) 06/28/2015   ALT 22 06/28/2015   AST 26 06/28/2015   NA 140 06/28/2015   K 4.0 06/28/2015   CL 104 06/28/2015   CREATININE 0.69 06/28/2015   BUN 10 06/28/2015   CO2 30 06/28/2015   TSH 2.37 06/28/2015    Mm Screening Breast Tomo Bilateral  Result Date: 07/07/2015 CLINICAL DATA:  Screening. EXAM: DIGITAL SCREENING BILATERAL MAMMOGRAM WITH 3D TOMO WITH CAD COMPARISON:  Previous exam(s). ACR Breast Density Category b: There are scattered areas of fibroglandular density. FINDINGS: There are no findings suspicious for malignancy. Images were processed with CAD. IMPRESSION: No mammographic evidence of malignancy. A result letter of this screening mammogram will be mailed directly to the patient. RECOMMENDATION: Screening mammogram in one year. (Code:SM-B-01Y) BI-RADS CATEGORY  1: Negative. Electronically Signed   By: Franki Cabot M.D.   On: 07/07/2015 09:50       Assessment & Plan:   Problem List Items Addressed This Visit    Anemia    On iron.  Check cbc and ferritin.         Relevant Orders   Ferritin   Health care maintenance    Physical today 06/28/16.  PAP 06/24/12 - negative with negative HPV.  Mammogram 07/07/15 - Birads I.  Scheduled for f/u mammogram.  Colonoscopy 05/2013.  Recommended f/u colonoscopy in five years.        Left hip pain    Better.  Sees a chiropractor q 6 months.  Stays active.        Osteoporosis    On fosamax.  Recheck vitamin D level today.  Recheck bone density.  Last 2014.        Relevant Orders   DG Bone Density  CBC with Differential/Platelet   Comprehensive metabolic panel   Lipid panel   TSH   VITAMIN D 25 Hydroxy (Vit-D Deficiency, Fractures)    Other Visit Diagnoses    Breast cancer screening       Relevant Orders   MM DIGITAL SCREENING BILATERAL   Estrogen deficiency       Relevant Orders   DG Bone Density       Einar Pheasant, MD

## 2016-06-28 NOTE — Addendum Note (Signed)
Addended by: Johna Sheriff on: 06/28/2016 11:27 AM   Modules accepted: Orders

## 2016-06-28 NOTE — Progress Notes (Signed)
Pre visit review using our clinic review tool, if applicable. No additional management support is needed unless otherwise documented below in the visit note. 

## 2016-06-28 NOTE — Assessment & Plan Note (Signed)
Physical today 06/28/16.  PAP 06/24/12 - negative with negative HPV.  Mammogram 07/07/15 - Birads I.  Scheduled for f/u mammogram.  Colonoscopy 05/2013.  Recommended f/u colonoscopy in five years.

## 2016-06-29 ENCOUNTER — Encounter: Payer: Self-pay | Admitting: *Deleted

## 2016-07-12 DIAGNOSIS — M810 Age-related osteoporosis without current pathological fracture: Secondary | ICD-10-CM | POA: Diagnosis not present

## 2016-07-24 ENCOUNTER — Telehealth: Payer: Self-pay

## 2016-07-24 MED ORDER — ALENDRONATE SODIUM 70 MG PO TABS: ORAL_TABLET | ORAL | 6 refills | Status: DC

## 2016-07-24 NOTE — Telephone Encounter (Signed)
ok'd refill for fosamax #4 with 6 refills.

## 2016-07-24 NOTE — Telephone Encounter (Signed)
Fax received from [place] [address]  Would like refill on fosamax

## 2016-07-26 ENCOUNTER — Ambulatory Visit: Payer: PPO

## 2016-07-31 ENCOUNTER — Telehealth: Payer: Self-pay

## 2016-07-31 DIAGNOSIS — M810 Age-related osteoporosis without current pathological fracture: Secondary | ICD-10-CM

## 2016-07-31 NOTE — Telephone Encounter (Signed)
Patient is fine with referral to any rheumatologist that you would recommend.

## 2016-07-31 NOTE — Telephone Encounter (Signed)
Results received per Dr. Nicki Reaper Bone density reveals osteoporosis with no significant changes when compared to previous. On fosamax notify that she would like her to be referred to rheumatology for discussion of further treatment of her bones.

## 2016-08-01 NOTE — Telephone Encounter (Signed)
Order placed for rheumatology referral.  

## 2016-08-02 DIAGNOSIS — M9905 Segmental and somatic dysfunction of pelvic region: Secondary | ICD-10-CM | POA: Diagnosis not present

## 2016-08-02 DIAGNOSIS — M9903 Segmental and somatic dysfunction of lumbar region: Secondary | ICD-10-CM | POA: Diagnosis not present

## 2016-08-02 DIAGNOSIS — M5136 Other intervertebral disc degeneration, lumbar region: Secondary | ICD-10-CM | POA: Diagnosis not present

## 2016-08-02 DIAGNOSIS — M955 Acquired deformity of pelvis: Secondary | ICD-10-CM | POA: Diagnosis not present

## 2016-08-14 ENCOUNTER — Telehealth: Payer: Self-pay | Admitting: Internal Medicine

## 2016-08-14 NOTE — Telephone Encounter (Signed)
Pt called and  stated that she has not heard anything from [facility]  to set her up . Please advise, thank you!  Call tp @ [phone]

## 2016-08-15 NOTE — Telephone Encounter (Signed)
Ok. Thank you.

## 2016-08-23 ENCOUNTER — Ambulatory Visit
Admission: RE | Admit: 2016-08-23 | Discharge: 2016-08-23 | Disposition: A | Discharge status: Home / Self Care | Payer: PPO | Source: Ambulatory Visit | Attending: Internal Medicine | Admitting: Internal Medicine

## 2016-08-23 DIAGNOSIS — Z1231 Encounter for screening mammogram for malignant neoplasm of breast: Secondary | ICD-10-CM | POA: Insufficient documentation

## 2016-08-23 DIAGNOSIS — Z1239 Encounter for other screening for malignant neoplasm of breast: Secondary | ICD-10-CM

## 2016-09-02 DIAGNOSIS — S82302A Unspecified fracture of lower end of left tibia, initial encounter for closed fracture: Secondary | ICD-10-CM | POA: Diagnosis not present

## 2016-09-02 DIAGNOSIS — M25572 Pain in left ankle and joints of left foot: Secondary | ICD-10-CM | POA: Diagnosis not present

## 2016-09-02 DIAGNOSIS — X501XXA Overexertion from prolonged static or awkward postures, initial encounter: Secondary | ICD-10-CM | POA: Diagnosis not present

## 2016-09-02 DIAGNOSIS — S82842A Displaced bimalleolar fracture of left lower leg, initial encounter for closed fracture: Secondary | ICD-10-CM | POA: Diagnosis not present

## 2016-09-02 DIAGNOSIS — M810 Age-related osteoporosis without current pathological fracture: Secondary | ICD-10-CM | POA: Diagnosis not present

## 2016-09-02 DIAGNOSIS — Y9355 Activity, bike riding: Secondary | ICD-10-CM | POA: Diagnosis not present

## 2016-09-02 DIAGNOSIS — Z79899 Other long term (current) drug therapy: Secondary | ICD-10-CM | POA: Diagnosis not present

## 2016-09-02 DIAGNOSIS — S82839A Other fracture of upper and lower end of unspecified fibula, initial encounter for closed fracture: Secondary | ICD-10-CM | POA: Diagnosis not present

## 2016-09-05 DIAGNOSIS — S82845A Nondisplaced bimalleolar fracture of left lower leg, initial encounter for closed fracture: Secondary | ICD-10-CM | POA: Diagnosis not present

## 2016-09-06 ENCOUNTER — Ambulatory Visit: Payer: PPO

## 2016-09-06 ENCOUNTER — Telehealth: Payer: Self-pay | Admitting: Internal Medicine

## 2016-09-06 NOTE — Telephone Encounter (Signed)
Noted! Thank you

## 2016-09-06 NOTE — Telephone Encounter (Signed)
FYI - Pt called and cancelled her appt this morning. Stated that she had a cast put on her leg yesterday and does not feel comfortable with it yet.

## 2016-09-20 DIAGNOSIS — S82845A Nondisplaced bimalleolar fracture of left lower leg, initial encounter for closed fracture: Secondary | ICD-10-CM | POA: Diagnosis not present

## 2016-09-30 IMAGING — MG MM SCREENING BREAST TOMO BILATERAL
9 of 12 series · 9 of 28 positions shown · non-contrast
Comparison: Previous exam(s).

CLINICAL DATA: Screening.

EXAM:
DIGITAL SCREENING BILATERAL MAMMOGRAM WITH 3D TOMO WITH CAD

[R MLO synth-2D]
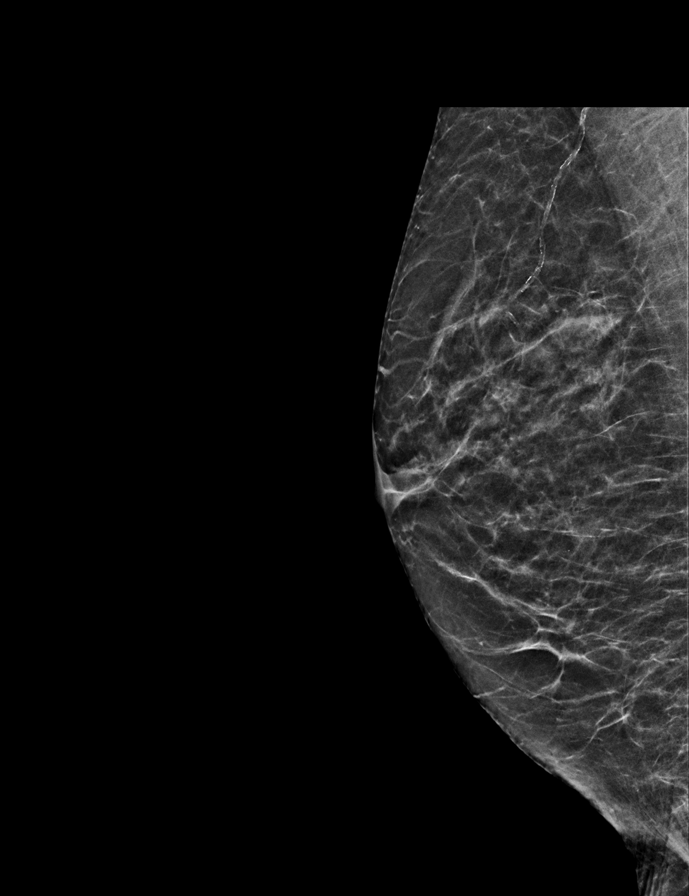

[R MLO]
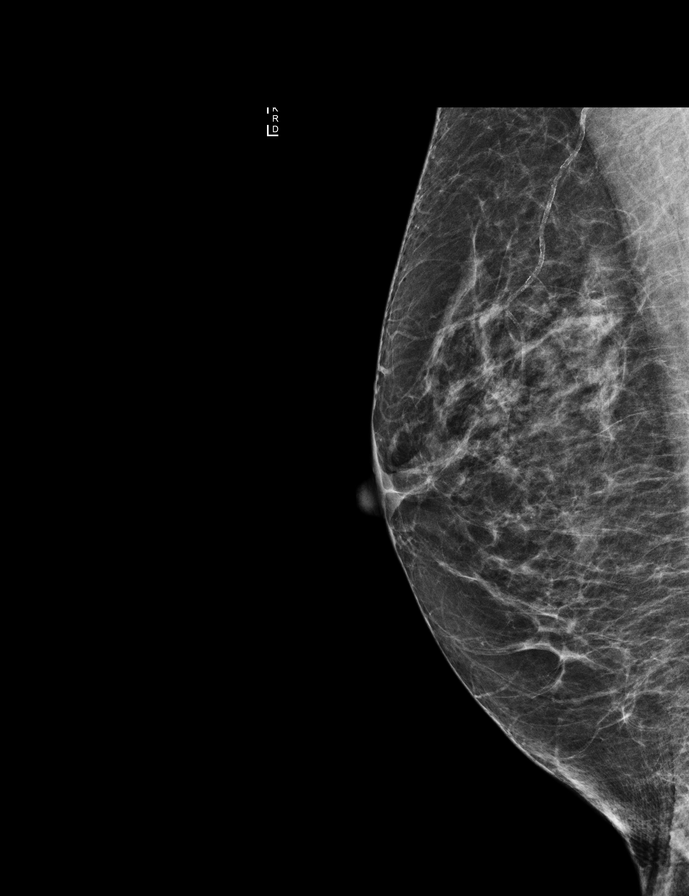

[L MLO synth-2D]
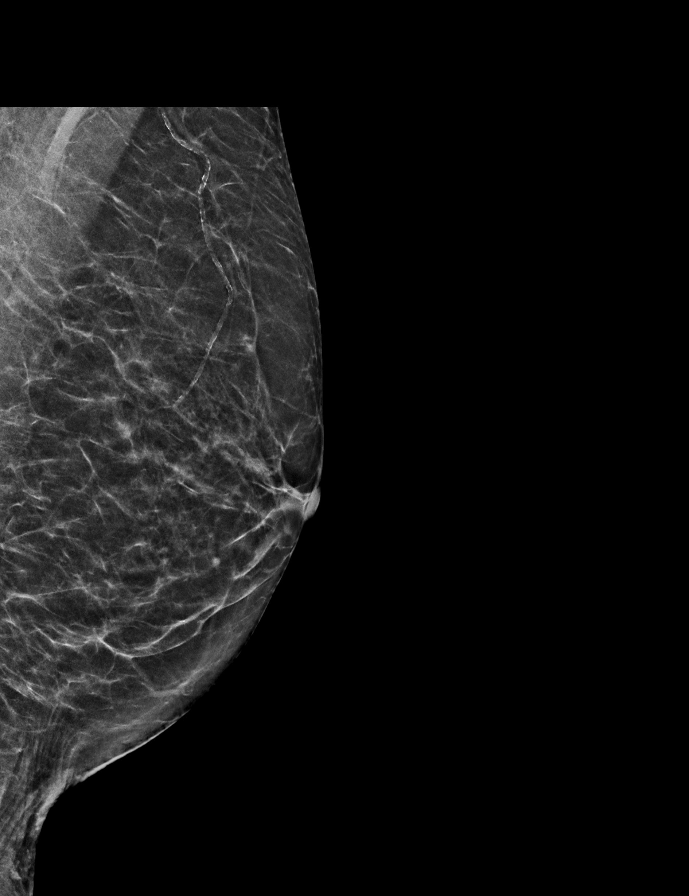

[R CC]
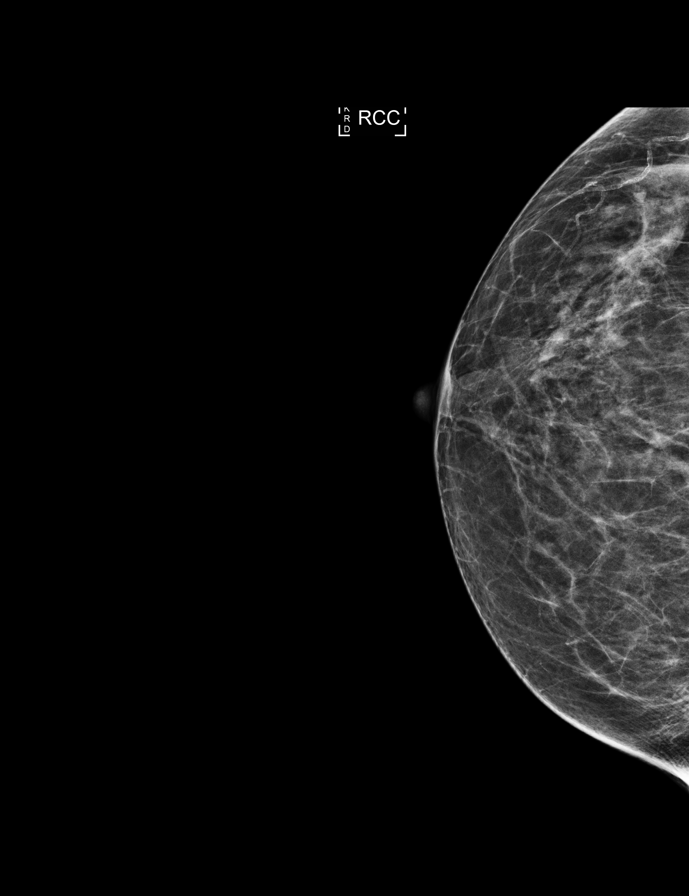

[L CC synth-2D]
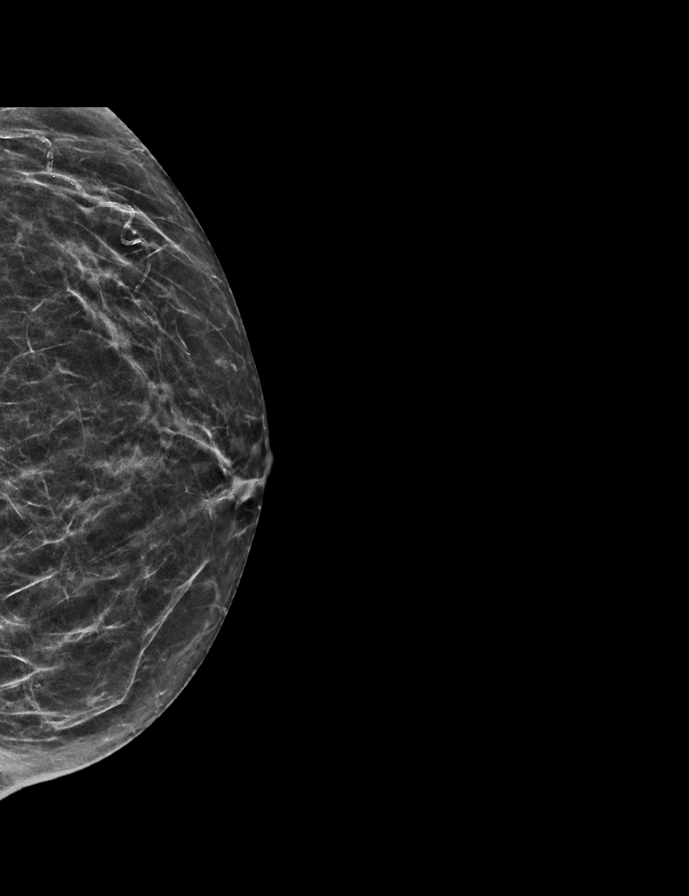

[R CC synth-2D]
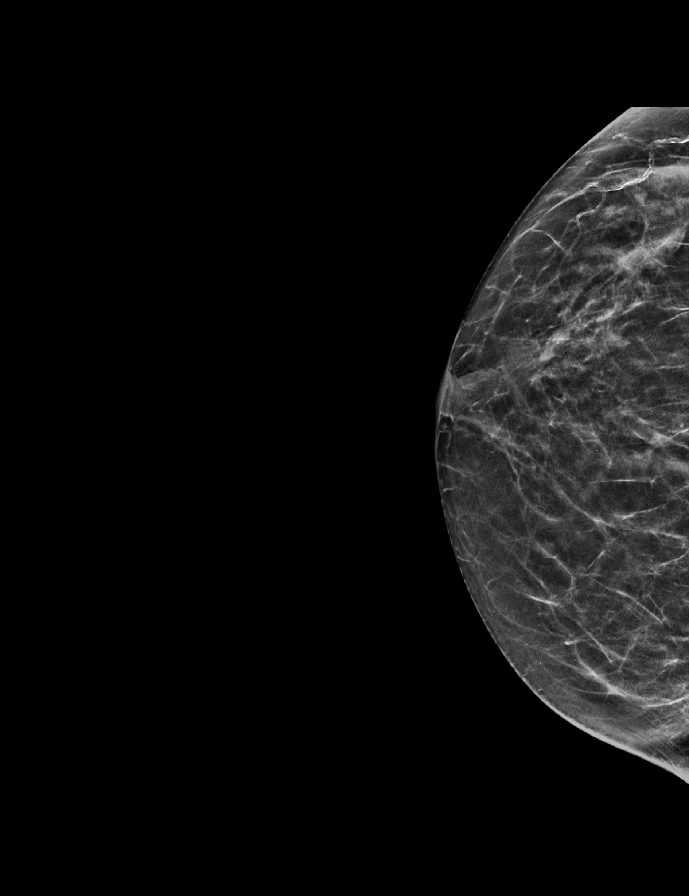

[L MLO]
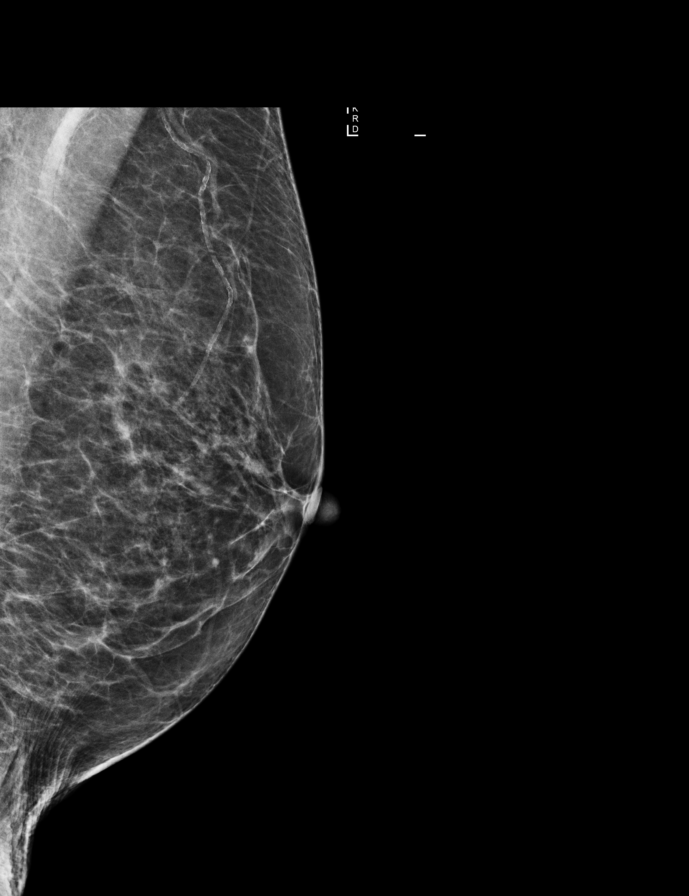

[L CC]
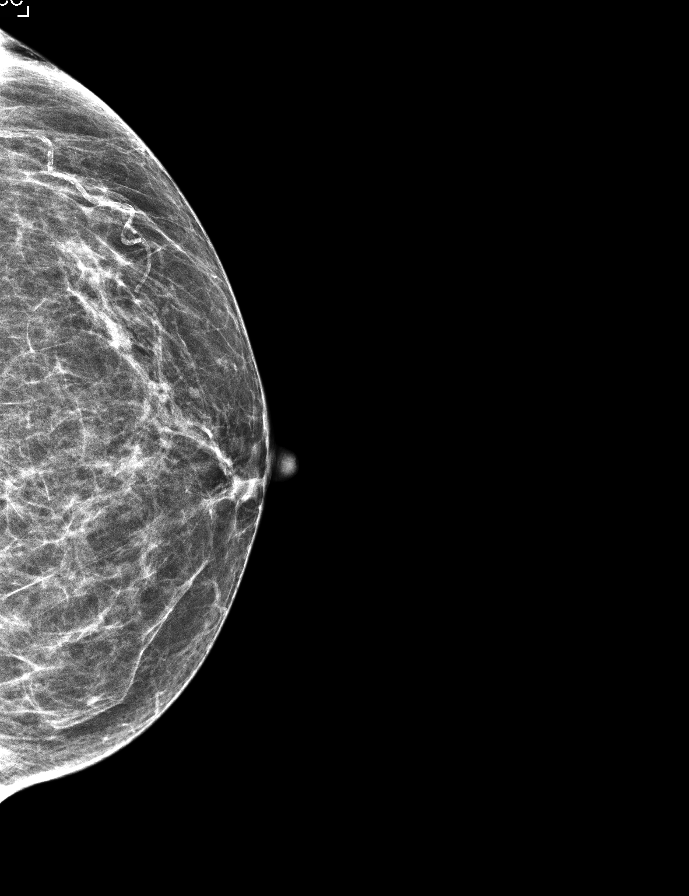

[R CC tomo · tomo slice 26/51.0]
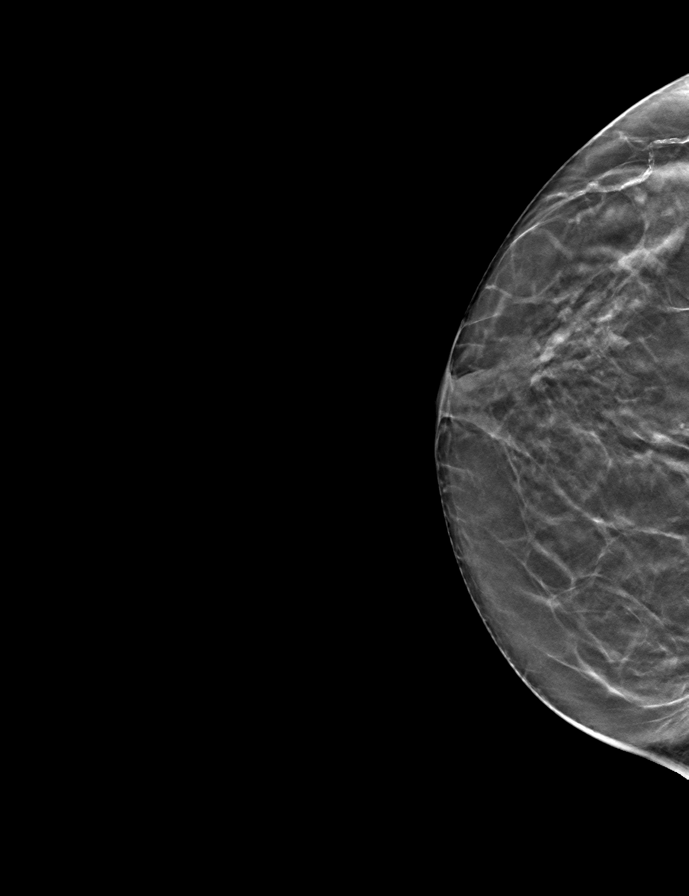

[9 of 28 positions shown; findings below may reference images not displayed]

ACR Breast Density Category b: There are scattered areas of
fibroglandular density.
FINDINGS: There are no findings suspicious for malignancy. Images were
processed with CAD.
IMPRESSION: No mammographic evidence of malignancy. A result letter of this
screening mammogram will be mailed directly to the patient.

RECOMMENDATION:
Screening mammogram in one year. (Code:55-L-23V)

BI-RADS CATEGORY  1: Negative.

## 2016-10-04 DIAGNOSIS — S82845D Nondisplaced bimalleolar fracture of left lower leg, subsequent encounter for closed fracture with routine healing: Secondary | ICD-10-CM | POA: Diagnosis not present

## 2016-10-11 DIAGNOSIS — M810 Age-related osteoporosis without current pathological fracture: Secondary | ICD-10-CM | POA: Diagnosis not present

## 2016-10-18 DIAGNOSIS — M810 Age-related osteoporosis without current pathological fracture: Secondary | ICD-10-CM | POA: Diagnosis not present

## 2016-10-25 ENCOUNTER — Ambulatory Visit (INDEPENDENT_AMBULATORY_CARE_PROVIDER_SITE_OTHER): Payer: PPO

## 2016-10-25 VITALS — BP 122/62 | HR 66 | Temp 98.3°F | Resp 12 | Ht 65.0 in | Wt 124.1 lb

## 2016-10-25 DIAGNOSIS — S82845D Nondisplaced bimalleolar fracture of left lower leg, subsequent encounter for closed fracture with routine healing: Secondary | ICD-10-CM | POA: Diagnosis not present

## 2016-10-25 DIAGNOSIS — Z0000 Encounter for general adult medical examination without abnormal findings: Secondary | ICD-10-CM | POA: Diagnosis not present

## 2016-10-25 NOTE — Progress Notes (Signed)
Subjective:   Sonya Hernandez is a 71 y.o. female who presents for an Initial Medicare Annual Wellness Visit.  Review of Systems    No ROS.  Medicare Wellness Visit.  Cardiac Risk Factors include: advanced age (>61men, >32 women)     Objective:    Today's Vitals   10/25/16 0955  BP: 122/62  Pulse: 66  Resp: 12  Temp: 98.3 F (36.8 C)  TempSrc: Oral  SpO2: 97%  Weight: 124 lb 1.9 oz (56.3 kg)  Height: 5\' 5"  (1.651 m)   Body mass index is 20.65 kg/m.   Current Medications (verified) Outpatient Encounter Prescriptions as of 10/25/2016  Medication Sig  . Calcium Carbonate-Vitamin D 600-400 MG-UNIT per chew tablet Chew 1 tablet by mouth 2 (two) times daily.   . Cholecalciferol 1000 UNITS capsule Take 1,000 Units by mouth daily.  . clobetasol cream (TEMOVATE) 3.00 % Apply 1 application topically 2 (two) times a week.  . denosumab (PROLIA) 60 MG/ML SOLN injection Inject 60 mg into the skin every 6 (six) months. Administer in upper arm, thigh, or abdomen  . estradiol (ESTRACE) 0.1 MG/GM vaginal cream Place 2 g vaginally 3 (three) times a week.   . ferrous sulfate 325 (65 FE) MG EC tablet Take 325 mg by mouth every other day.   Marland Kitchen FLUZONE QUADRIVALENT injection   . [DISCONTINUED] alendronate (FOSAMAX) 70 MG tablet Take 1 tablet (70 mg total) by mouth every 7 (seven) days. Take with a full glass of water on an empty stomach.   No facility-administered encounter medications on file as of 10/25/2016.     Allergies (verified) Patient has no known allergies.   History: Past Medical History:  Diagnosis Date  . Anemia   . Osteoporosis    Past Surgical History:  Procedure Laterality Date  . NO PAST SURGERIES     Family History  Problem Relation Age of Onset  . Diabetes Mother   . Thyroid disease Mother        Questionable  . Colon cancer Mother        Questionable  . Heart attack Father   . Deep vein thrombosis Father   . Diabetes Sister   . Breast cancer Neg Hx      Social History   Occupational History  . Not on file.   Social History Main Topics  . Smoking status: Never Smoker  . Smokeless tobacco: Never Used  . Alcohol use No  . Drug use: No  . Sexual activity: Yes    Tobacco Counseling Counseling given: Not Answered   Activities of Daily Living In your present state of health, do you have any difficulty performing the following activities: 10/25/2016  Hearing? N  Vision? N  Difficulty concentrating or making decisions? N  Walking or climbing stairs? Y  Dressing or bathing? N  Doing errands, shopping? N  Preparing Food and eating ? N  Using the Toilet? N  In the past six months, have you accidently leaked urine? N  Do you have problems with loss of bowel control? N  Managing your Medications? N  Managing your Finances? N  Housekeeping or managing your Housekeeping? N  Some recent data might be hidden    Immunizations and Health Maintenance Immunization History  Administered Date(s) Administered  . Influenza,inj,Quad PF,36+ Mos 06/25/2013, 06/25/2014, 06/28/2015  . Influenza-Unspecified 06/14/2016  . Pneumococcal Conjugate-13 06/25/2013  . Pneumococcal Polysaccharide-23 06/28/2016   Health Maintenance Due  Topic Date Due  . Hepatitis C Screening  1945/11/11  . TETANUS/TDAP  12/07/1962    Patient Care Team: Einar Pheasant, MD as PCP - General (Internal Medicine)  Indicate any recent Medical Services you may have received from other than [facility] providers in the past year (date may be approximate).     Assessment:   This is a routine wellness examination for [name]. The goal of the wellness visit is to assist the patient how to close the gaps in care and create a preventative care plan for the patient.   Taking calcium VIT D and Prolia as appropriate/Osteoporosis reviewed.  Medications reviewed; taking without issues or barriers.  Safety issues reviewed; smoke and carbon monoxide detectors in the home.  Firearms locked up in the home. Wears seatbelts when driving or riding with others. Patient does wear sunscreen or protective clothing when in direct sunlight. No violence in the home.  Depression- PHQ 2 &9 complete.  No signs/symptoms or verbal communication regarding little pleasure in doing things, feeling down, depressed or hopeless. No changes in sleeping, energy, eating, concentrating.  No thoughts of self harm or harm towards others.  Time spent on this topic is 8 minutes.   Patient is alert, normal appearance, oriented to person/place/and time. Correctly identified the president of the [place], recall of 3/3 words, and performing simple calculations.  Patient displays appropriate judgement and can read correct time from watch face.  No new identified risk were noted.  No failures at ADL's or IADL's.   BMI- discussed the importance of a healthy diet, water intake and exercise. Educational material provided.   Daily fluid intake: 8 cups of water  Dental- every 12 months.  Sleep patterns- Sleeps 6-7 hours at night.  Wakes feeling rested.  TDAP vaccine deferred per patient preference.  Follow up with insurance.  Educational material provided.  Hepatitis C Screening discussed.  Educational material provided.  Patient Concerns: None at this time. Follow up with PCP as needed.  Hearing/Vision screen Hearing Screening Comments: Patient has some difficulty hearing conversational tones in a crowd and when watching television.  Audiology testing deferred per patient preference.   Vision Screening Comments: Followed by [facility] Phillip Heal) Last OV 2017 Cataract extraction, bilateral Visual acuity not assessed per patient preference since they have regular follow up with the ophthalmologist  Dietary issues and exercise activities discussed: Current Exercise Habits: Home exercise routine, Type of exercise: stretching;strength training/weights (Chair exercises.  Dancing.), Time  (Minutes): 10, Frequency (Times/Week): 7, Weekly Exercise (Minutes/Week): 70, Intensity: Mild  Goals    . Increase physical activity          Physical therapy as directed      Depression Screen PHQ 2/9 Scores 10/25/2016 06/28/2016 06/28/2015 06/25/2014 06/25/2013 07/04/2012  PHQ - 2 Score 0 0 0 0 0 0  PHQ- 9 Score 0 - - - - -    Fall Risk Fall Risk  10/25/2016 06/28/2016 06/28/2015 06/25/2014 06/25/2013  Falls in the past year? Yes No Yes No No  Number falls in past yr: 2 or more - 1 - -  Injury with Fall? Yes - Yes - -  Follow up Falls prevention discussed;Education provided - - - -    Cognitive Function: MMSE - Mini Mental State Exam 10/25/2016  Orientation to time 5  Orientation to Place 5  Registration 3  Attention/ Calculation 5  Recall 3  Language- name 2 objects 2  Language- repeat 1  Language- follow 3 step command 3  Language- read & follow direction 1  Write  a sentence 1  Copy design 1  Total score 30        Screening Tests Health Maintenance  Topic Date Due  . Hepatitis C Screening  December 12, 1945  . TETANUS/TDAP  12/07/1962  . INFLUENZA VACCINE  01/03/2017  . MAMMOGRAM  08/24/2018  . COLONOSCOPY  06/03/2023  . DEXA SCAN  Completed  . PNA vac Low Risk Adult  Completed      Plan:    End of life planning; Advance aging; Advanced directives discussed. Copy of current HCPOA/Living Will requested.    I have personally reviewed and noted the following in the patient's chart:   . Medical and social history . Use of alcohol, tobacco or illicit drugs  . Current medications and supplements . Functional ability and status . Nutritional status . Physical activity . Advanced directives . List of other physicians . Hospitalizations, surgeries, and ER visits in previous 12 months . Vitals . Screenings to include cognitive, depression, and falls . Referrals and appointments  In addition, I have reviewed and discussed with patient certain preventive protocols,  quality metrics, and best practice recommendations. A written personalized care plan for preventive services as well as general preventive health recommendations were provided to patient.     Varney Biles, LPN   09/01/1914   Agree with plan. Mable Paris, NP

## 2016-10-25 NOTE — Patient Instructions (Addendum)
  Sonya Hernandez , Thank you for taking time to come for your Medicare Wellness Visit. I appreciate your ongoing commitment to your health goals. Please review the following plan we discussed and let me know if I can assist you in the future.   Follow up with Dr. Nicki Reaper as needed.    Bring a copy of your [place] and/or Living Will to be scanned into chart.  Have a great day!  These are the goals we discussed: Goals    . Increase physical activity          Physical therapy as directed       This is a list of the screening recommended for you and due dates:  Health Maintenance  Topic Date Due  .  Hepatitis C: One time screening is recommended by Center for Disease Control  (CDC) for  adults born from [birth year] through 1967.   01-20-1946  . Tetanus Vaccine  12/07/1962  . Flu Shot  01/03/2017  . Mammogram  08/24/2018  . Colon Cancer Screening  06/03/2023  . DEXA scan (bone density measurement)  Completed  . Pneumonia vaccines  Completed

## 2016-11-10 DIAGNOSIS — M25672 Stiffness of left ankle, not elsewhere classified: Secondary | ICD-10-CM | POA: Diagnosis not present

## 2016-11-10 DIAGNOSIS — M25572 Pain in left ankle and joints of left foot: Secondary | ICD-10-CM | POA: Diagnosis not present

## 2016-11-14 DIAGNOSIS — M25572 Pain in left ankle and joints of left foot: Secondary | ICD-10-CM | POA: Diagnosis not present

## 2016-11-14 DIAGNOSIS — M25672 Stiffness of left ankle, not elsewhere classified: Secondary | ICD-10-CM | POA: Diagnosis not present

## 2016-11-16 DIAGNOSIS — M25572 Pain in left ankle and joints of left foot: Secondary | ICD-10-CM | POA: Diagnosis not present

## 2016-11-16 DIAGNOSIS — M25672 Stiffness of left ankle, not elsewhere classified: Secondary | ICD-10-CM | POA: Diagnosis not present

## 2016-11-22 DIAGNOSIS — M25572 Pain in left ankle and joints of left foot: Secondary | ICD-10-CM | POA: Diagnosis not present

## 2016-11-22 DIAGNOSIS — M25672 Stiffness of left ankle, not elsewhere classified: Secondary | ICD-10-CM | POA: Diagnosis not present

## 2017-03-06 DIAGNOSIS — M9905 Segmental and somatic dysfunction of pelvic region: Secondary | ICD-10-CM | POA: Diagnosis not present

## 2017-03-06 DIAGNOSIS — M5136 Other intervertebral disc degeneration, lumbar region: Secondary | ICD-10-CM | POA: Diagnosis not present

## 2017-03-06 DIAGNOSIS — M9903 Segmental and somatic dysfunction of lumbar region: Secondary | ICD-10-CM | POA: Diagnosis not present

## 2017-03-06 DIAGNOSIS — M955 Acquired deformity of pelvis: Secondary | ICD-10-CM | POA: Diagnosis not present

## 2017-04-17 DIAGNOSIS — M955 Acquired deformity of pelvis: Secondary | ICD-10-CM | POA: Diagnosis not present

## 2017-04-17 DIAGNOSIS — M9905 Segmental and somatic dysfunction of pelvic region: Secondary | ICD-10-CM | POA: Diagnosis not present

## 2017-04-17 DIAGNOSIS — M9903 Segmental and somatic dysfunction of lumbar region: Secondary | ICD-10-CM | POA: Diagnosis not present

## 2017-04-17 DIAGNOSIS — M5136 Other intervertebral disc degeneration, lumbar region: Secondary | ICD-10-CM | POA: Diagnosis not present

## 2017-04-23 DIAGNOSIS — M810 Age-related osteoporosis without current pathological fracture: Secondary | ICD-10-CM | POA: Diagnosis not present

## 2017-05-22 DIAGNOSIS — M955 Acquired deformity of pelvis: Secondary | ICD-10-CM | POA: Diagnosis not present

## 2017-05-22 DIAGNOSIS — M5136 Other intervertebral disc degeneration, lumbar region: Secondary | ICD-10-CM | POA: Diagnosis not present

## 2017-05-22 DIAGNOSIS — M9903 Segmental and somatic dysfunction of lumbar region: Secondary | ICD-10-CM | POA: Diagnosis not present

## 2017-05-22 DIAGNOSIS — M9905 Segmental and somatic dysfunction of pelvic region: Secondary | ICD-10-CM | POA: Diagnosis not present

## 2017-06-07 DIAGNOSIS — H26499 Other secondary cataract, unspecified eye: Secondary | ICD-10-CM | POA: Diagnosis not present

## 2017-06-07 DIAGNOSIS — Z961 Presence of intraocular lens: Secondary | ICD-10-CM | POA: Diagnosis not present

## 2017-06-20 DIAGNOSIS — M9903 Segmental and somatic dysfunction of lumbar region: Secondary | ICD-10-CM | POA: Diagnosis not present

## 2017-06-20 DIAGNOSIS — M5136 Other intervertebral disc degeneration, lumbar region: Secondary | ICD-10-CM | POA: Diagnosis not present

## 2017-06-20 DIAGNOSIS — M9905 Segmental and somatic dysfunction of pelvic region: Secondary | ICD-10-CM | POA: Diagnosis not present

## 2017-06-20 DIAGNOSIS — M955 Acquired deformity of pelvis: Secondary | ICD-10-CM | POA: Diagnosis not present

## 2017-07-02 ENCOUNTER — Encounter: Payer: Self-pay | Admitting: Internal Medicine

## 2017-07-02 ENCOUNTER — Ambulatory Visit (INDEPENDENT_AMBULATORY_CARE_PROVIDER_SITE_OTHER): Payer: Medicare HMO | Admitting: Internal Medicine

## 2017-07-02 VITALS — BP 108/68 | HR 66 | Temp 97.9°F | Ht 65.0 in | Wt 126.8 lb

## 2017-07-02 DIAGNOSIS — M810 Age-related osteoporosis without current pathological fracture: Secondary | ICD-10-CM

## 2017-07-02 DIAGNOSIS — Z1231 Encounter for screening mammogram for malignant neoplasm of breast: Secondary | ICD-10-CM | POA: Diagnosis not present

## 2017-07-02 DIAGNOSIS — Z0000 Encounter for general adult medical examination without abnormal findings: Secondary | ICD-10-CM

## 2017-07-02 DIAGNOSIS — Z13220 Encounter for screening for lipoid disorders: Secondary | ICD-10-CM

## 2017-07-02 DIAGNOSIS — Z1239 Encounter for other screening for malignant neoplasm of breast: Secondary | ICD-10-CM

## 2017-07-02 DIAGNOSIS — D649 Anemia, unspecified: Secondary | ICD-10-CM

## 2017-07-02 LAB — COMPREHENSIVE METABOLIC PANEL
ALT: 11 U/L (ref 0–35)
AST: 16 U/L (ref 0–37)
Albumin: 4 g/dL (ref 3.5–5.2)
Alkaline Phosphatase: 44 U/L (ref 39–117)
BILIRUBIN TOTAL: 0.6 mg/dL (ref 0.2–1.2)
BUN: 21 mg/dL (ref 6–23)
CHLORIDE: 103 meq/L (ref 96–112)
CO2: 32 meq/L (ref 19–32)
CREATININE: 0.87 mg/dL (ref 0.40–1.20)
Calcium: 9.8 mg/dL (ref 8.4–10.5)
GFR: 67.73 mL/min (ref >60.00)
GLUCOSE: 102 mg/dL — AB (ref 70–99)
Potassium: 4.6 mEq/L (ref 3.5–5.1)
SODIUM: 141 meq/L (ref 135–145)
Total Protein: 6.6 g/dL (ref 6.0–8.3)

## 2017-07-02 LAB — CBC WITH DIFFERENTIAL/PLATELET
BASOS ABS: 0 10*3/uL (ref 0.0–0.1)
BASOS PCT: 0.4 % (ref 0.0–3.0)
EOS ABS: 0.3 10*3/uL (ref 0.0–0.7)
Eosinophils Relative: 3.9 % (ref 0.0–5.0)
HCT: 40.6 % (ref 36.0–46.0)
Hemoglobin: 13.8 g/dL (ref 12.0–15.0)
Lymphocytes Relative: 38 % (ref 12.0–46.0)
Lymphs Abs: 2.6 10*3/uL (ref 0.7–4.0)
MCHC: 33.9 g/dL (ref 30.0–36.0)
MCV: 93.6 fl (ref 78.0–100.0)
MONO ABS: 0.6 10*3/uL (ref 0.1–1.0)
Monocytes Relative: 8.3 % (ref 3.0–12.0)
NEUTROS ABS: 3.3 10*3/uL (ref 1.4–7.7)
Neutrophils Relative %: 49.4 % (ref 43.0–77.0)
PLATELETS: 227 10*3/uL (ref 150.0–400.0)
RBC: 4.34 Mil/uL (ref 3.87–5.11)
RDW: 12.8 % (ref 11.5–15.5)
WBC: 6.8 10*3/uL (ref 4.0–10.5)

## 2017-07-02 LAB — LIPID PANEL
CHOL/HDL RATIO: 3
Cholesterol: 192 mg/dL (ref 0–200)
HDL: 55.9 mg/dL (ref >39.00)
LDL CALC: 124 mg/dL — AB (ref 0–99)
NONHDL: 136.13
Triglycerides: 63 mg/dL (ref 0.0–149.0)
VLDL: 12.6 mg/dL (ref 0.0–40.0)

## 2017-07-02 LAB — VITAMIN D 25 HYDROXY (VIT D DEFICIENCY, FRACTURES): VITD: 39.97 ng/mL (ref 30.00–100.00)

## 2017-07-02 LAB — TSH: TSH: 3.04 u[IU]/mL (ref 0.35–4.50)

## 2017-07-02 LAB — FERRITIN: Ferritin: 85 ng/mL (ref 10.0–291.0)

## 2017-07-02 NOTE — Assessment & Plan Note (Addendum)
Physical today 07/02/17.  PAP 06/24/12 - negative with negative HPV.  Mammogram 08/23/16 - Birads I.  Colonoscopy 05/2013.  Recommended  f/u colonoscopy in 5 years.  Will be due end of this year.  Scheduled for f/u mammogram.  She will send information regarding date of flu shot.

## 2017-07-02 NOTE — Progress Notes (Signed)
Pre visit review using our clinic review tool, if applicable. No additional management support is needed unless otherwise documented below in the visit note. 

## 2017-07-02 NOTE — Progress Notes (Signed)
Patient ID: Sonya Hernandez, female   DOB: January 25, 1946, 72 y.o.   MRN: [id number]   Subjective:    Patient ID: Sonya Hernandez, female    DOB: July 20, 1945, 72 y.o.   MRN: [id number]  HPI  Patient here for her physical exam.  She reports she is doing well.  Recent bimalleolar fracture.  Healed.  Has been released.  No pain now.  Stays active.  Exercises.  No chest pain.  No sob.  No acid reflux.  No abdominal pain.  Bowels moving.  No urine change.  Overall feels good.  Saw Dr Gabriel Carina.  On prolia now.     Past Medical History:  Diagnosis Date  . Anemia   . Osteoporosis    Past Surgical History:  Procedure Laterality Date  . NO PAST SURGERIES     Family History  Problem Relation Age of Onset  . Diabetes Mother   . Thyroid disease Mother        Questionable  . Colon cancer Mother        Questionable  . Heart attack Father   . Deep vein thrombosis Father   . Diabetes Sister   . Breast cancer Neg Hx    Social History   Socioeconomic History  . Marital status: Married    Spouse name: None  . Number of children: 3  . Years of education: None  . Highest education level: None  Social Needs  . Financial resource strain: None  . Food insecurity - worry: None  . Food insecurity - inability: None  . Transportation needs - medical: None  . Transportation needs - non-medical: None  Occupational History  . None  Tobacco Use  . Smoking status: Never Smoker  . Smokeless tobacco: Never Used  Substance and Sexual Activity  . Alcohol use: No    Alcohol/week: 0.0 oz  . Drug use: No  . Sexual activity: Yes  Other Topics Concern  . None  Social History Narrative   She is married and has 3 daughters.    Outpatient Encounter Medications as of 07/02/2017  Medication Sig  . Calcium Carbonate-Vitamin D 600-400 MG-UNIT per chew tablet Chew 1 tablet by mouth 2 (two) times daily.   . Cholecalciferol 1000 UNITS capsule Take 1,000 Units by mouth daily.  . clobetasol cream (TEMOVATE) 7.20  % Apply 1 application topically 2 (two) times a week.  . denosumab (PROLIA) 60 MG/ML SOLN injection Inject 60 mg into the skin every 6 (six) months. Administer in upper arm, thigh, or abdomen  . estradiol (ESTRACE) 0.1 MG/GM vaginal cream Place 2 g vaginally 3 (three) times a week.   . ferrous sulfate 325 (65 FE) MG EC tablet Take 325 mg by mouth every other day.   . [DISCONTINUED] FLUZONE QUADRIVALENT injection    No facility-administered encounter medications on file as of 07/02/2017.     Review of Systems  Constitutional: Negative for appetite change and unexpected weight change.  HENT: Negative for congestion and sinus pressure.   Eyes: Negative for pain and visual disturbance.  Respiratory: Negative for cough, chest tightness and shortness of breath.   Cardiovascular: Negative for chest pain, palpitations and leg swelling.  Gastrointestinal: Negative for abdominal pain, diarrhea, nausea and vomiting.  Genitourinary: Negative for difficulty urinating and dysuria.  Musculoskeletal: Negative for back pain and joint swelling.  Skin: Negative for color change and rash.  Neurological: Negative for dizziness, light-headedness and headaches.  Hematological: Negative for adenopathy. Does not bruise/bleed easily.  Psychiatric/Behavioral:  Negative for agitation and dysphoric mood.       Objective:     Blood pressure rechecked by me:  118/70  Physical Exam  Constitutional: She is oriented to person, place, and time. She appears well-developed and well-nourished. No distress.  HENT:  Nose: Nose normal.  Mouth/Throat: Oropharynx is clear and moist.  Eyes: Right eye exhibits no discharge. Left eye exhibits no discharge. No scleral icterus.  Neck: Neck supple. No thyromegaly present.  Cardiovascular: Normal rate and regular rhythm.  Pulmonary/Chest: Breath sounds normal. No accessory muscle usage. No tachypnea. No respiratory distress. She has no decreased breath sounds. She has no wheezes.  She has no rhonchi. Right breast exhibits no inverted nipple, no mass, no nipple discharge and no tenderness (no axillary adenopathy). Left breast exhibits no inverted nipple, no mass, no nipple discharge and no tenderness (no axilarry adenopathy).  Abdominal: Soft. Bowel sounds are normal. There is no tenderness.  Musculoskeletal: She exhibits no edema or tenderness.  Lymphadenopathy:    She has no cervical adenopathy.  Neurological: She is alert and oriented to person, place, and time.  Skin: Skin is warm. No rash noted. No erythema.  Psychiatric: She has a normal mood and affect. Her behavior is normal.    BP 108/68   Pulse 66   Temp 97.9 F (36.6 C) (Oral)   Ht 5\' 5"  (1.651 m)   Wt 126 lb 12.8 oz (57.5 kg)   LMP 06/24/1993   SpO2 97%   BMI 21.10 kg/m  Wt Readings from Last 3 Encounters:  07/02/17 126 lb 12.8 oz (57.5 kg)  10/25/16 124 lb 1.9 oz (56.3 kg)  06/28/16 123 lb 6 oz (56 kg)     Lab Results  Component Value Date   WBC 6.8 07/02/2017   HGB 13.8 07/02/2017   HCT 40.6 07/02/2017   PLT 227.0 07/02/2017   GLUCOSE 102 (H) 07/02/2017   CHOL 192 07/02/2017   TRIG 63.0 07/02/2017   HDL 55.90 07/02/2017   LDLDIRECT 157.7 06/24/2012   LDLCALC 124 (H) 07/02/2017   ALT 11 07/02/2017   AST 16 07/02/2017   NA 141 07/02/2017   K 4.6 07/02/2017   CL 103 07/02/2017   CREATININE 0.87 07/02/2017   BUN 21 07/02/2017   CO2 32 07/02/2017   TSH 3.04 07/02/2017    Mm Digital Screening Bilateral  Result Date: 08/23/2016 CLINICAL DATA:  Screening. EXAM: DIGITAL SCREENING BILATERAL MAMMOGRAM WITH CAD COMPARISON:  Previous exam(s). ACR Breast Density Category b: There are scattered areas of fibroglandular density. FINDINGS: There are no findings suspicious for malignancy. Images were processed with CAD. IMPRESSION: No mammographic evidence of malignancy. A result letter of this screening mammogram will be mailed directly to the patient. RECOMMENDATION: Screening mammogram in one  year. (Code:SM-B-01Y) BI-RADS CATEGORY  1: Negative. Electronically Signed   By: Ammie Ferrier M.D.   On: 08/23/2016 13:40       Assessment & Plan:   Problem List Items Addressed This Visit    Anemia    Recheck cbc.       Relevant Orders   CBC with Differential/Platelet (Completed)   Ferritin (Completed)   Health care maintenance    Physical today 07/02/17.  PAP 06/24/12 - negative with negative HPV.  Mammogram 08/23/16 - Birads I.  Colonoscopy 05/2013.  Recommended  f/u colonoscopy in 5 years.  Will be due end of this year.  Scheduled for f/u mammogram.  She will send information regarding date of flu shot.  Osteoporosis    Previously on fosamax.  No significant improvement. Seeing Dr Gabriel Carina.  Receiving prolia now.        Relevant Orders   Comprehensive metabolic panel (Completed)   TSH (Completed)   VITAMIN D 25 Hydroxy (Vit-D Deficiency, Fractures) (Completed)    Other Visit Diagnoses    Breast cancer screening    -  Primary   Relevant Orders   MM DIGITAL SCREENING BILATERAL   Screening cholesterol level       Relevant Orders   Lipid panel (Completed)       Einar Pheasant, MD

## 2017-07-02 NOTE — Assessment & Plan Note (Signed)
Previously on fosamax.  No significant improvement. Seeing Dr Gabriel Carina.  Receiving prolia now.

## 2017-07-03 ENCOUNTER — Telehealth: Payer: Self-pay

## 2017-07-03 ENCOUNTER — Encounter: Payer: Self-pay | Admitting: Internal Medicine

## 2017-07-03 MED ORDER — ROSUVASTATIN CALCIUM 10 MG PO TABS: 10.0000 mg | ORAL_TABLET | Freq: Every day | ORAL | 1 refills | Status: DC

## 2017-07-03 NOTE — Assessment & Plan Note (Signed)
Recheck cbc.  

## 2017-07-18 DIAGNOSIS — M9905 Segmental and somatic dysfunction of pelvic region: Secondary | ICD-10-CM | POA: Diagnosis not present

## 2017-07-18 DIAGNOSIS — M5136 Other intervertebral disc degeneration, lumbar region: Secondary | ICD-10-CM | POA: Diagnosis not present

## 2017-07-18 DIAGNOSIS — M955 Acquired deformity of pelvis: Secondary | ICD-10-CM | POA: Diagnosis not present

## 2017-07-18 DIAGNOSIS — M9903 Segmental and somatic dysfunction of lumbar region: Secondary | ICD-10-CM | POA: Diagnosis not present

## 2017-08-13 ENCOUNTER — Telehealth: Payer: Self-pay | Admitting: Radiology

## 2017-08-13 ENCOUNTER — Other Ambulatory Visit: Payer: Self-pay | Admitting: Internal Medicine

## 2017-08-13 DIAGNOSIS — H43813 Vitreous degeneration, bilateral: Secondary | ICD-10-CM | POA: Diagnosis not present

## 2017-08-13 DIAGNOSIS — H33002 Unspecified retinal detachment with retinal break, left eye: Secondary | ICD-10-CM | POA: Diagnosis not present

## 2017-08-13 DIAGNOSIS — H43393 Other vitreous opacities, bilateral: Secondary | ICD-10-CM | POA: Diagnosis not present

## 2017-08-13 DIAGNOSIS — H33012 Retinal detachment with single break, left eye: Secondary | ICD-10-CM | POA: Diagnosis not present

## 2017-08-13 DIAGNOSIS — E7800 Pure hypercholesterolemia, unspecified: Secondary | ICD-10-CM

## 2017-08-13 NOTE — Telephone Encounter (Signed)
Pt coming in for labs tomorrow, please place future orders. Thank you.  

## 2017-08-13 NOTE — Progress Notes (Signed)
Order placed for labs.

## 2017-08-13 NOTE — Telephone Encounter (Signed)
Order placed for labs.

## 2017-08-14 ENCOUNTER — Other Ambulatory Visit (INDEPENDENT_AMBULATORY_CARE_PROVIDER_SITE_OTHER): Payer: Medicare HMO

## 2017-08-14 DIAGNOSIS — E7800 Pure hypercholesterolemia, unspecified: Secondary | ICD-10-CM

## 2017-08-14 LAB — HEPATIC FUNCTION PANEL
ALK PHOS: 43 U/L (ref 39–117)
ALT: 37 U/L — ABNORMAL HIGH (ref 0–35)
AST: 37 U/L (ref 0–37)
Albumin: 4.2 g/dL (ref 3.5–5.2)
BILIRUBIN DIRECT: 0.1 mg/dL (ref 0.0–0.3)
BILIRUBIN TOTAL: 0.6 mg/dL (ref 0.2–1.2)
Total Protein: 6.8 g/dL (ref 6.0–8.3)

## 2017-08-15 ENCOUNTER — Other Ambulatory Visit: Payer: Self-pay | Admitting: Internal Medicine

## 2017-08-15 DIAGNOSIS — R7989 Other specified abnormal findings of blood chemistry: Secondary | ICD-10-CM

## 2017-08-15 DIAGNOSIS — R945 Abnormal results of liver function studies: Secondary | ICD-10-CM

## 2017-08-15 DIAGNOSIS — H33012 Retinal detachment with single break, left eye: Secondary | ICD-10-CM | POA: Diagnosis not present

## 2017-08-15 DIAGNOSIS — H3322 Serous retinal detachment, left eye: Secondary | ICD-10-CM | POA: Diagnosis not present

## 2017-08-15 DIAGNOSIS — E785 Hyperlipidemia, unspecified: Secondary | ICD-10-CM | POA: Diagnosis not present

## 2017-08-15 NOTE — Progress Notes (Signed)
Order placed for f/u liver panel.  

## 2017-08-24 ENCOUNTER — Other Ambulatory Visit (INDEPENDENT_AMBULATORY_CARE_PROVIDER_SITE_OTHER): Payer: Medicare HMO

## 2017-08-24 DIAGNOSIS — R7989 Other specified abnormal findings of blood chemistry: Secondary | ICD-10-CM

## 2017-08-24 DIAGNOSIS — R945 Abnormal results of liver function studies: Secondary | ICD-10-CM | POA: Diagnosis not present

## 2017-08-24 LAB — HEPATIC FUNCTION PANEL
ALBUMIN: 3.9 g/dL (ref 3.5–5.2)
ALT: 23 U/L (ref 0–35)
AST: 25 U/L (ref 0–37)
Alkaline Phosphatase: 44 U/L (ref 39–117)
Bilirubin, Direct: 0.1 mg/dL (ref 0.0–0.3)
TOTAL PROTEIN: 6.6 g/dL (ref 6.0–8.3)
Total Bilirubin: 0.6 mg/dL (ref 0.2–1.2)

## 2017-09-18 ENCOUNTER — Ambulatory Visit
Admission: RE | Admit: 2017-09-18 | Discharge: 2017-09-18 | Disposition: A | Discharge status: Home / Self Care | Payer: Medicare HMO | Source: Ambulatory Visit | Attending: Internal Medicine | Admitting: Internal Medicine

## 2017-09-18 DIAGNOSIS — Z1239 Encounter for other screening for malignant neoplasm of breast: Secondary | ICD-10-CM

## 2017-09-18 DIAGNOSIS — Z1231 Encounter for screening mammogram for malignant neoplasm of breast: Secondary | ICD-10-CM | POA: Insufficient documentation

## 2017-10-11 DIAGNOSIS — H33012 Retinal detachment with single break, left eye: Secondary | ICD-10-CM | POA: Diagnosis not present

## 2017-10-11 DIAGNOSIS — H35342 Macular cyst, hole, or pseudohole, left eye: Secondary | ICD-10-CM | POA: Diagnosis not present

## 2017-10-12 DIAGNOSIS — M810 Age-related osteoporosis without current pathological fracture: Secondary | ICD-10-CM | POA: Diagnosis not present

## 2017-10-19 DIAGNOSIS — M810 Age-related osteoporosis without current pathological fracture: Secondary | ICD-10-CM | POA: Diagnosis not present

## 2017-10-22 DIAGNOSIS — M810 Age-related osteoporosis without current pathological fracture: Secondary | ICD-10-CM | POA: Diagnosis not present

## 2017-10-26 ENCOUNTER — Ambulatory Visit: Payer: PPO

## 2017-10-31 ENCOUNTER — Ambulatory Visit (INDEPENDENT_AMBULATORY_CARE_PROVIDER_SITE_OTHER): Payer: Medicare HMO

## 2017-10-31 VITALS — BP 102/62 | HR 59 | Temp 97.7°F | Resp 14 | Ht 65.0 in | Wt 131.8 lb

## 2017-10-31 DIAGNOSIS — Z0000 Encounter for general adult medical examination without abnormal findings: Secondary | ICD-10-CM

## 2017-10-31 NOTE — Progress Notes (Addendum)
Subjective:   Sonya Hernandez is a 72 y.o. female who presents for Medicare Annual (Subsequent) preventive examination.  Review of Systems:  No ROS.  Medicare Wellness Visit. Additional risk factors are reflected in the social history.  Cardiac Risk Factors include: advanced age (>29men, >83 women)     Objective:     Vitals: BP 102/62 (BP Location: Left Arm, Patient Position: Sitting, Cuff Size: Normal)   Pulse (!) 59   Temp 97.7 F (36.5 C) (Oral)   Resp 14   Ht 5\' 5"  (1.651 m)   Wt 131 lb 12.8 oz (59.8 kg)   LMP 06/24/1993   SpO2 97%   BMI 21.93 kg/m   Body mass index is 21.93 kg/m.  Advanced Directives 10/31/2017 10/25/2016  Does Patient Have a Medical Advance Directive? Yes Yes  Type of Paramedic of [place];Living will [place];Living will  Does patient want to make changes to medical advance directive? No - Patient declined No - Patient declined  Copy of [place] in Chart? No - copy requested No - copy requested    Tobacco Social History   Tobacco Use  Smoking Status Never Smoker  Smokeless Tobacco Never Used     Counseling given: Not Answered   Clinical Intake:  Pre-visit preparation completed: Yes        Nutritional Status: BMI of 19-24  Normal Diabetes: No  How often do you need to have someone help you when you read instructions, pamphlets, or other written materials from your doctor or pharmacy?: 1 - Never  Interpreter Needed?: No     Past Medical History:  Diagnosis Date  . Anemia   . Osteoporosis    Past Surgical History:  Procedure Laterality Date  . NO PAST SURGERIES     Family History  Problem Relation Age of Onset  . Diabetes Mother   . Thyroid disease Mother        Questionable  . Colon cancer Mother        Questionable  . Heart attack Father   . Deep vein thrombosis Father   . Diabetes Sister   . Breast cancer Neg Hx    Social History    Socioeconomic History  . Marital status: Married    Spouse name: Not on file  . Number of children: 3  . Years of education: Not on file  . Highest education level: Not on file  Occupational History  . Not on file  Social Needs  . Financial resource strain: Not hard at all  . Food insecurity:    Worry: Never true    Inability: Never true  . Transportation needs:    Medical: No    Non-medical: No  Tobacco Use  . Smoking status: Never Smoker  . Smokeless tobacco: Never Used  Substance and Sexual Activity  . Alcohol use: No    Alcohol/week: 0.0 oz  . Drug use: No  . Sexual activity: Yes  Lifestyle  . Physical activity:    Days per week: Not on file    Minutes per session: Not on file  . Stress: Not on file  Relationships  . Social connections:    Talks on phone: Not on file    Gets together: Not on file    Attends religious service: Not on file    Active member of club or organization: Not on file    Attends meetings of clubs or organizations: Not on file  Relationship status: Not on file  Other Topics Concern  . Not on file  Social History Narrative   She is married and has 3 daughters.    Outpatient Encounter Medications as of 10/31/2017  Medication Sig  . Calcium Carbonate-Vitamin D 600-400 MG-UNIT per chew tablet Chew 1 tablet by mouth 2 (two) times daily.   . Cholecalciferol 1000 UNITS capsule Take 1,000 Units by mouth daily.  . clobetasol cream (TEMOVATE) 4.03 % Apply 1 application topically 2 (two) times a week.  . denosumab (PROLIA) 60 MG/ML SOLN injection Inject 60 mg into the skin every 6 (six) months. Administer in upper arm, thigh, or abdomen  . estradiol (ESTRACE) 0.1 MG/GM vaginal cream Place 2 g vaginally 3 (three) times a week.   . ferrous sulfate 325 (65 FE) MG EC tablet Take 325 mg by mouth every other day.   . rosuvastatin (CRESTOR) 10 MG tablet Take 1 tablet (10 mg total) by mouth daily.   No facility-administered encounter medications on  file as of 10/31/2017.     Activities of Daily Living In your present state of health, do you have any difficulty performing the following activities: 10/31/2017  Hearing? Y  Comment Audiology deferred in the last year.   Vision? N  Difficulty concentrating or making decisions? N  Walking or climbing stairs? N  Dressing or bathing? N  Doing errands, shopping? N  Preparing Food and eating ? N  Using the Toilet? N  In the past six months, have you accidently leaked urine? N  Do you have problems with loss of bowel control? N  Managing your Medications? N  Managing your Finances? N  Housekeeping or managing your Housekeeping? N  Some recent data might be hidden    Patient Care Team: Einar Pheasant, MD as PCP - General (Internal Medicine)    Assessment:   This is a routine wellness examination for Joleigh.  The goal of the wellness visit is to assist the patient how to close the gaps in care and create a preventative care plan for the patient.   The roster of all physicians providing medical care to patient is listed in the Snapshot section of the chart.  Taking calcium VIT D as appropriate/Osteoporosis reviewed.   Prolia injections currently administered every 6 months.  Safety issues reviewed; Smoke and carbon monoxide detectors in the home. No firearms in the home. Wears seatbelts when driving or riding with others. No violence in the home.  They do not have excessive sun exposure.  Discussed the need for sun protection: hats, long sleeves and the use of sunscreen if there is significant sun exposure.  Patient is alert, normal appearance, oriented to person/place/and time.  Correctly identified the president of the [place] and recalls of 3/3 words. Performs simple calculations and can read correct time from watch face. Displays appropriate judgement.  No new identified risk were noted.  No failures at ADL's or IADL's.   BMI- discussed the importance of a healthy diet, water  intake and the benefits of aerobic exercise. Educational material provided.   24 hour diet recall: Low cholesterol/low carb  Dental- every 12 months.   Eye- Visual acuity not assessed per patient preference since they have regular follow up with the ophthalmologist.  Wears corrective lenses.  Sleep patterns- Sleeps 7 hours at night.  Wakes feeling rested.   TDAP vaccine deferred per patient preference.  Follow up with insurance.  Educational material provided.  Hepatitis C Screening declined.   Cholesterol followed  by pcp.  Future lab scheduled.   Exercise Activities and Dietary recommendations Current Exercise Habits: Home exercise routine, Type of exercise: walking;stretching, Time (Minutes): 60, Frequency (Times/Week): 5, Weekly Exercise (Minutes/Week): 300  Goals    . Maintain Healthy Lifestyle       Fall Risk Fall Risk  10/31/2017 07/02/2017 10/25/2016 06/28/2016 06/28/2015  Falls in the past year? No Yes Yes No Yes  Number falls in past yr: - 1 2 or more - 1  Injury with Fall? - Yes Yes - Yes  Follow up - - Falls prevention discussed;Education provided - -   Depression Screen PHQ 2/9 Scores 10/31/2017 07/02/2017 10/25/2016 06/28/2016  PHQ - 2 Score 0 0 0 0  PHQ- 9 Score - - 0 -     Cognitive Function MMSE - Mini Mental State Exam 10/31/2017 10/25/2016  Orientation to time 5 5  Orientation to Place 5 5  Registration 3 3  Attention/ Calculation 5 5  Recall 3 3  Language- name 2 objects 2 2  Language- repeat 1 1  Language- follow 3 step command 3 3  Language- read & follow direction 1 1  Write a sentence 1 1  Copy design 1 1  Total score 30 30        Immunization History  Administered Date(s) Administered  . Influenza,inj,Quad PF,6+ Mos 06/25/2013, 06/25/2014, 06/28/2015  . Influenza-Unspecified 06/14/2016  . Pneumococcal Conjugate-13 06/25/2013  . Pneumococcal Polysaccharide-23 06/28/2016  . Zoster Recombinat (Shingrix) 05/16/2017   Screening Tests Health  Maintenance  Topic Date Due  . Hepatitis C Screening  04/07/1946  . TETANUS/TDAP  12/07/1962  . INFLUENZA VACCINE  01/03/2018  . MAMMOGRAM  09/19/2019  . COLONOSCOPY  06/03/2023  . DEXA SCAN  Completed  . PNA vac Low Risk Adult  Completed      Plan:    End of life planning; Advance aging; Advanced directives discussed. Copy of current HCPOA/Living Will requested.    I have personally reviewed and noted the following in the patient's chart:   . Medical and social history . Use of alcohol, tobacco or illicit drugs  . Current medications and supplements . Functional ability and status . Nutritional status . Physical activity . Advanced directives . List of other physicians . Hospitalizations, surgeries, and ER visits in previous 12 months . Vitals . Screenings to include cognitive, depression, and falls . Referrals and appointments  In addition, I have reviewed and discussed with patient certain preventive protocols, quality metrics, and best practice recommendations. A written personalized care plan for preventive services as well as general preventive health recommendations were provided to patient.     Varney Biles, LPN  3/84/6659   Reviewed above information.  Agree with assessment and plan.    Dr Nicki Reaper

## 2017-10-31 NOTE — Patient Instructions (Addendum)
  Sonya Hernandez , Thank you for taking time to come for your Medicare Wellness Visit. I appreciate your ongoing commitment to your health goals. Please review the following plan we discussed and let me know if I can assist you in the future.    Future labs.   These are the goals we discussed: Goals    . Maintain Healthy Lifestyle       This is a list of the screening recommended for you and due dates:  Health Maintenance  Topic Date Due  .  Hepatitis C: One time screening is recommended by Center for Disease Control  (CDC) for  adults born from [birth year] through 1967.   01-30-1946  . Tetanus Vaccine  12/07/1962  . Flu Shot  01/03/2018  . Mammogram  09/19/2019  . Colon Cancer Screening  06/03/2023  . DEXA scan (bone density measurement)  Completed  . Pneumonia vaccines  Completed

## 2017-11-01 ENCOUNTER — Other Ambulatory Visit: Payer: Self-pay | Admitting: Internal Medicine

## 2017-11-01 DIAGNOSIS — E7800 Pure hypercholesterolemia, unspecified: Secondary | ICD-10-CM

## 2017-11-01 NOTE — Progress Notes (Signed)
Orders placed for f/u labs.  

## 2017-11-07 ENCOUNTER — Other Ambulatory Visit (INDEPENDENT_AMBULATORY_CARE_PROVIDER_SITE_OTHER): Payer: Medicare HMO

## 2017-11-07 DIAGNOSIS — E7800 Pure hypercholesterolemia, unspecified: Secondary | ICD-10-CM | POA: Diagnosis not present

## 2017-11-07 LAB — LIPID PANEL
CHOL/HDL RATIO: 3
CHOLESTEROL: 145 mg/dL (ref 0–200)
HDL: 46.5 mg/dL (ref >39.00)
LDL CALC: 87 mg/dL (ref 0–99)
NonHDL: 98.42
Triglycerides: 57 mg/dL (ref 0.0–149.0)
VLDL: 11.4 mg/dL (ref 0.0–40.0)

## 2017-11-07 LAB — HEPATIC FUNCTION PANEL
ALT: 19 U/L (ref 0–35)
AST: 23 U/L (ref 0–37)
Albumin: 4 g/dL (ref 3.5–5.2)
Alkaline Phosphatase: 50 U/L (ref 39–117)
BILIRUBIN DIRECT: 0.1 mg/dL (ref 0.0–0.3)
BILIRUBIN TOTAL: 0.6 mg/dL (ref 0.2–1.2)
Total Protein: 6.4 g/dL (ref 6.0–8.3)

## 2018-01-17 ENCOUNTER — Other Ambulatory Visit: Payer: Self-pay | Admitting: Internal Medicine

## 2018-04-26 DIAGNOSIS — M810 Age-related osteoporosis without current pathological fracture: Secondary | ICD-10-CM | POA: Diagnosis not present

## 2018-06-07 ENCOUNTER — Ambulatory Visit (INDEPENDENT_AMBULATORY_CARE_PROVIDER_SITE_OTHER): Payer: Medicare Other | Admitting: Internal Medicine

## 2018-06-07 ENCOUNTER — Encounter: Payer: Self-pay | Admitting: Internal Medicine

## 2018-06-07 VITALS — BP 112/70 | HR 67 | Temp 97.8°F | Resp 18 | Wt 123.4 lb

## 2018-06-07 DIAGNOSIS — M810 Age-related osteoporosis without current pathological fracture: Secondary | ICD-10-CM | POA: Diagnosis not present

## 2018-06-07 DIAGNOSIS — Z23 Encounter for immunization: Secondary | ICD-10-CM | POA: Diagnosis not present

## 2018-06-07 DIAGNOSIS — E7800 Pure hypercholesterolemia, unspecified: Secondary | ICD-10-CM | POA: Diagnosis not present

## 2018-06-07 DIAGNOSIS — D649 Anemia, unspecified: Secondary | ICD-10-CM | POA: Diagnosis not present

## 2018-06-07 LAB — CBC WITH DIFFERENTIAL/PLATELET
BASOS ABS: 0 10*3/uL (ref 0.0–0.1)
BASOS PCT: 0.5 % (ref 0.0–3.0)
Eosinophils Absolute: 0.2 10*3/uL (ref 0.0–0.7)
Eosinophils Relative: 4.8 % (ref 0.0–5.0)
HEMATOCRIT: 42.5 % (ref 36.0–46.0)
Hemoglobin: 14.1 g/dL (ref 12.0–15.0)
LYMPHS PCT: 46.6 % — AB (ref 12.0–46.0)
Lymphs Abs: 2.4 10*3/uL (ref 0.7–4.0)
MCHC: 33.3 g/dL (ref 30.0–36.0)
MCV: 93.9 fl (ref 78.0–100.0)
MONO ABS: 0.4 10*3/uL (ref 0.1–1.0)
Monocytes Relative: 7.5 % (ref 3.0–12.0)
Neutro Abs: 2.1 10*3/uL (ref 1.4–7.7)
Neutrophils Relative %: 40.6 % — ABNORMAL LOW (ref 43.0–77.0)
Platelets: 212 10*3/uL (ref 150.0–400.0)
RBC: 4.53 Mil/uL (ref 3.87–5.11)
RDW: 13.1 % (ref 11.5–15.5)
WBC: 5.1 10*3/uL (ref 4.0–10.5)

## 2018-06-07 LAB — COMPREHENSIVE METABOLIC PANEL
ALBUMIN: 3.9 g/dL (ref 3.5–5.2)
ALT: 43 U/L — AB (ref 0–35)
AST: 35 U/L (ref 0–37)
Alkaline Phosphatase: 65 U/L (ref 39–117)
BILIRUBIN TOTAL: 0.5 mg/dL (ref 0.2–1.2)
BUN: 11 mg/dL (ref 6–23)
CALCIUM: 9.5 mg/dL (ref 8.4–10.5)
CHLORIDE: 103 meq/L (ref 96–112)
CO2: 31 mEq/L (ref 19–32)
CREATININE: 0.75 mg/dL (ref 0.40–1.20)
GFR: 80.18 mL/min (ref >60.00)
Glucose, Bld: 93 mg/dL (ref 70–99)
Potassium: 4.7 mEq/L (ref 3.5–5.1)
SODIUM: 140 meq/L (ref 135–145)
TOTAL PROTEIN: 6 g/dL (ref 6.0–8.3)

## 2018-06-07 LAB — LIPID PANEL
CHOLESTEROL: 144 mg/dL (ref 0–200)
HDL: 40.3 mg/dL (ref >39.00)
LDL Cholesterol: 89 mg/dL (ref 0–99)
NonHDL: 103.25
TRIGLYCERIDES: 73 mg/dL (ref 0.0–149.0)
Total CHOL/HDL Ratio: 4
VLDL: 14.6 mg/dL (ref 0.0–40.0)

## 2018-06-07 LAB — TSH: TSH: 3.05 u[IU]/mL (ref 0.35–4.50)

## 2018-06-07 MED ORDER — ROSUVASTATIN CALCIUM 10 MG PO TABS: 10.0000 mg | ORAL_TABLET | Freq: Every day | ORAL | 1 refills | Status: DC

## 2018-06-07 NOTE — Progress Notes (Signed)
Patient ID: Sonya Hernandez, female   DOB: January 02, 1946, 73 y.o.   MRN: [id number]   Subjective:    Patient ID: Sonya Hernandez, female    DOB: Mar 26, 1946, 73 y.o.   MRN: [id number]  HPI  Patient here for a scheduled follow up.  She reports she is doing well.  Feels good.  Has adjusted her diet.  Lost weight.  Stays active.  No chest pain.  No sob.  No acid reflux.  No abdominal pain.  Bowels moving.  No urine change.  Feels good.  Discussed due for colonoscopy.     Past Medical History:  Diagnosis Date  . Anemia   . Osteoporosis    Past Surgical History:  Procedure Laterality Date  . NO PAST SURGERIES     Family History  Problem Relation Age of Onset  . Diabetes Mother   . Thyroid disease Mother        Questionable  . Colon cancer Mother        Questionable  . Heart attack Father   . Deep vein thrombosis Father   . Diabetes Sister   . Breast cancer Neg Hx    Social History   Socioeconomic History  . Marital status: Married    Spouse name: Not on file  . Number of children: 3  . Years of education: Not on file  . Highest education level: Not on file  Occupational History  . Not on file  Social Needs  . Financial resource strain: Not hard at all  . Food insecurity:    Worry: Never true    Inability: Never true  . Transportation needs:    Medical: No    Non-medical: No  Tobacco Use  . Smoking status: Never Smoker  . Smokeless tobacco: Never Used  Substance and Sexual Activity  . Alcohol use: No    Alcohol/week: 0.0 standard drinks  . Drug use: No  . Sexual activity: Yes  Lifestyle  . Physical activity:    Days per week: Not on file    Minutes per session: Not on file  . Stress: Not on file  Relationships  . Social connections:    Talks on phone: Not on file    Gets together: Not on file    Attends religious service: Not on file    Active member of club or organization: Not on file    Attends meetings of clubs or organizations: Not on file   Relationship status: Not on file  Other Topics Concern  . Not on file  Social History Narrative   She is married and has 3 daughters.    Outpatient Encounter Medications as of 06/07/2018  Medication Sig  . Calcium Carbonate-Vitamin D 600-400 MG-UNIT per chew tablet Chew 1 tablet by mouth 2 (two) times daily.   . Cholecalciferol 1000 UNITS capsule Take 1,000 Units by mouth daily.  . clobetasol cream (TEMOVATE) 6.16 % Apply 1 application topically 2 (two) times a week.  . denosumab (PROLIA) 60 MG/ML SOLN injection Inject 60 mg into the skin every 6 (six) months. Administer in upper arm, thigh, or abdomen  . estradiol (ESTRACE) 0.1 MG/GM vaginal cream Place 2 g vaginally 3 (three) times a week.   . ferrous sulfate 325 (65 FE) MG EC tablet Take 325 mg by mouth every other day.   . rosuvastatin (CRESTOR) 10 MG tablet Take 1 tablet (10 mg total) by mouth daily.  . [DISCONTINUED] rosuvastatin (CRESTOR) 10 MG tablet Take 1 tablet (10  mg total) by mouth daily.   No facility-administered encounter medications on file as of 06/07/2018.     Review of Systems  Constitutional: Negative for appetite change.       Has adjusted diet.  Weight is down.    HENT: Negative for congestion and sinus pressure.   Respiratory: Negative for cough, chest tightness and shortness of breath.   Cardiovascular: Negative for chest pain, palpitations and leg swelling.  Gastrointestinal: Negative for abdominal pain, diarrhea, nausea and vomiting.  Genitourinary: Negative for difficulty urinating and dysuria.  Musculoskeletal: Negative for joint swelling and myalgias.  Skin: Negative for color change and rash.  Neurological: Negative for dizziness, light-headedness and headaches.  Psychiatric/Behavioral: Negative for agitation and dysphoric mood.       Objective:    Physical Exam Constitutional:      General: She is not in acute distress.    Appearance: Normal appearance.  HENT:     Nose: Nose normal. No  congestion.     Mouth/Throat:     Pharynx: No oropharyngeal exudate or posterior oropharyngeal erythema.  Neck:     Musculoskeletal: Neck supple. No muscular tenderness.     Thyroid: No thyromegaly.  Cardiovascular:     Rate and Rhythm: Normal rate and regular rhythm.  Pulmonary:     Effort: No respiratory distress.     Breath sounds: Normal breath sounds. No wheezing.  Abdominal:     General: Bowel sounds are normal.     Palpations: Abdomen is soft.     Tenderness: There is no abdominal tenderness.  Musculoskeletal:        General: No swelling or tenderness.  Lymphadenopathy:     Cervical: No cervical adenopathy.  Skin:    Findings: No erythema or rash.  Neurological:     Mental Status: She is alert.  Psychiatric:        Mood and Affect: Mood normal.        Behavior: Behavior normal.     BP 112/70 (BP Location: Left Arm, Patient Position: Sitting, Cuff Size: Normal)   Pulse 67   Temp 97.8 F (36.6 C) (Oral)   Resp 18   Wt 123 lb 6.4 oz (56 kg)   LMP 06/24/1993   SpO2 98%   BMI 20.53 kg/m  Wt Readings from Last 3 Encounters:  06/07/18 123 lb 6.4 oz (56 kg)  10/31/17 131 lb 12.8 oz (59.8 kg)  07/02/17 126 lb 12.8 oz (57.5 kg)     Lab Results  Component Value Date   WBC 5.1 06/07/2018   HGB 14.1 06/07/2018   HCT 42.5 06/07/2018   PLT 212.0 06/07/2018   GLUCOSE 93 06/07/2018   CHOL 144 06/07/2018   TRIG 73.0 06/07/2018   HDL 40.30 06/07/2018   LDLDIRECT 157.7 06/24/2012   LDLCALC 89 06/07/2018   ALT 43 (H) 06/07/2018   AST 35 06/07/2018   NA 140 06/07/2018   K 4.7 06/07/2018   CL 103 06/07/2018   CREATININE 0.75 06/07/2018   BUN 11 06/07/2018   CO2 31 06/07/2018   TSH 3.05 06/07/2018    Mm Digital Screening Bilateral  Result Date: 09/18/2017 CLINICAL DATA:  Screening. EXAM: DIGITAL SCREENING BILATERAL MAMMOGRAM WITH CAD COMPARISON:  Previous exam(s). ACR Breast Density Category b: There are scattered areas of fibroglandular density. FINDINGS: There  are no findings suspicious for malignancy. Images were processed with CAD. IMPRESSION: No mammographic evidence of malignancy. A result letter of this screening mammogram will be mailed directly to the patient.  RECOMMENDATION: Screening mammogram in one year. (Code:SM-B-01Y) BI-RADS CATEGORY  1: Negative. Electronically Signed   By: Fidela Salisbury M.D.   On: 09/18/2017 13:53       Assessment & Plan:   Problem List Items Addressed This Visit    Anemia    Follow cbc.       Hypercholesteremia - Primary    Follow lipid panel.        Relevant Medications   rosuvastatin (CRESTOR) 10 MG tablet   Other Relevant Orders   CBC with Differential/Platelet (Completed)   Comprehensive metabolic panel (Completed)   Lipid panel (Completed)   TSH (Completed)   Osteoporosis    Receiving prolia now.  Follow.         Other Visit Diagnoses    Encounter for immunization       Relevant Orders   Flu vaccine HIGH DOSE PF (Completed)       Einar Pheasant, MD

## 2018-06-09 ENCOUNTER — Encounter: Payer: Self-pay | Admitting: Internal Medicine

## 2018-06-09 ENCOUNTER — Other Ambulatory Visit: Payer: Self-pay | Admitting: Internal Medicine

## 2018-06-09 DIAGNOSIS — R945 Abnormal results of liver function studies: Secondary | ICD-10-CM

## 2018-06-09 DIAGNOSIS — R7989 Other specified abnormal findings of blood chemistry: Secondary | ICD-10-CM

## 2018-06-09 NOTE — Assessment & Plan Note (Signed)
Follow cbc.  

## 2018-06-09 NOTE — Assessment & Plan Note (Signed)
Follow lipid panel.   

## 2018-06-09 NOTE — Assessment & Plan Note (Signed)
Receiving prolia now.  Follow.

## 2018-06-09 NOTE — Progress Notes (Signed)
Order placed for f/u liver panel.  

## 2018-06-10 ENCOUNTER — Telehealth: Payer: Self-pay | Admitting: Internal Medicine

## 2018-06-10 NOTE — Telephone Encounter (Signed)
Patient calling to get lab results. Nurse triage/NOD currently unavailable. Please advise.    Copied from [phone]. Topic: Quick Communication - Lab Results (Clinic Use ONLY) >> Jun 10, 2018 12:20 PM Lars Masson, LPN wrote: Called patient to inform them of lab results. When patient returns call, triage nurse may disclose results.

## 2018-06-11 NOTE — Telephone Encounter (Signed)
Patient notified of test results and appointment has been scheduled.

## 2018-06-12 DIAGNOSIS — H26499 Other secondary cataract, unspecified eye: Secondary | ICD-10-CM | POA: Diagnosis not present

## 2018-06-12 DIAGNOSIS — H33309 Unspecified retinal break, unspecified eye: Secondary | ICD-10-CM | POA: Diagnosis not present

## 2018-06-12 DIAGNOSIS — Z961 Presence of intraocular lens: Secondary | ICD-10-CM | POA: Diagnosis not present

## 2018-06-21 ENCOUNTER — Other Ambulatory Visit (INDEPENDENT_AMBULATORY_CARE_PROVIDER_SITE_OTHER): Payer: Medicare Other

## 2018-06-21 DIAGNOSIS — R7989 Other specified abnormal findings of blood chemistry: Secondary | ICD-10-CM

## 2018-06-21 DIAGNOSIS — R945 Abnormal results of liver function studies: Secondary | ICD-10-CM

## 2018-06-21 LAB — HEPATIC FUNCTION PANEL
ALT: 35 U/L (ref 0–35)
AST: 33 U/L (ref 0–37)
Albumin: 3.9 g/dL (ref 3.5–5.2)
Alkaline Phosphatase: 62 U/L (ref 39–117)
BILIRUBIN DIRECT: 0.1 mg/dL (ref 0.0–0.3)
BILIRUBIN TOTAL: 0.6 mg/dL (ref 0.2–1.2)
Total Protein: 6.4 g/dL (ref 6.0–8.3)

## 2018-06-22 ENCOUNTER — Encounter: Payer: Self-pay | Admitting: Internal Medicine

## 2018-09-11 ENCOUNTER — Encounter: Payer: Medicare HMO | Admitting: Internal Medicine

## 2018-10-01 DIAGNOSIS — M810 Age-related osteoporosis without current pathological fracture: Secondary | ICD-10-CM | POA: Diagnosis not present

## 2018-10-09 DIAGNOSIS — M810 Age-related osteoporosis without current pathological fracture: Secondary | ICD-10-CM | POA: Diagnosis not present

## 2018-10-30 DIAGNOSIS — M810 Age-related osteoporosis without current pathological fracture: Secondary | ICD-10-CM | POA: Diagnosis not present

## 2018-11-06 ENCOUNTER — Other Ambulatory Visit: Payer: Self-pay

## 2018-11-06 ENCOUNTER — Ambulatory Visit (INDEPENDENT_AMBULATORY_CARE_PROVIDER_SITE_OTHER): Payer: Medicare Other

## 2018-11-06 DIAGNOSIS — Z0000 Encounter for general adult medical examination without abnormal findings: Secondary | ICD-10-CM

## 2018-11-06 NOTE — Patient Instructions (Addendum)
  Sonya Hernandez , Thank you for taking time to come for your Medicare Wellness Visit. I appreciate your ongoing commitment to your health goals. Please review the following plan we discussed and let me know if I can assist you in the future.   These are the goals we discussed: Goals    . Follow up with Primary Care Provider     As needed       This is a list of the screening recommended for you and due dates:  Health Maintenance  Topic Date Due  .  Hepatitis C: One time screening is recommended by Center for Disease Control  (CDC) for  adults born from [birth year] through 1967.   12-03-1945  . Tetanus Vaccine  12/07/1962  . Flu Shot  01/04/2019  . Mammogram  09/19/2019  . Colon Cancer Screening  06/03/2023  . DEXA scan (bone density measurement)  Completed  . Pneumonia vaccines  Completed

## 2018-11-06 NOTE — Progress Notes (Signed)
Subjective:   Sonya Hernandez is a 73 y.o. female who presents for Medicare Annual (Subsequent) preventive examination.  Review of Systems:  No ROS.  Medicare Wellness Virtual Visit.  Visual/audio telehealth visit, UTA vital signs.   See social history for additional risk factors.   Cardiac Risk Factors include: advanced age (>55men, >71 women)     Objective:     Vitals: LMP 06/24/1993   There is no height or weight on file to calculate BMI.  Advanced Directives 11/06/2018 10/31/2017 10/25/2016  Does Patient Have a Medical Advance Directive? Yes Yes Yes  Type of Paramedic of [place];Living will [place];Living will [place];Living will  Does patient want to make changes to medical advance directive? No - Patient declined No - Patient declined No - Patient declined  Copy of [place] in Chart? No - copy requested No - copy requested No - copy requested    Tobacco Social History   Tobacco Use  Smoking Status Never Smoker  Smokeless Tobacco Never Used     Counseling given: Not Answered   Clinical Intake:  Pre-visit preparation completed: Yes           How often do you need to have someone help you when you read instructions, pamphlets, or other written materials from your doctor or pharmacy?: 1 - Never  Interpreter Needed?: No     Past Medical History:  Diagnosis Date   Anemia    Osteoporosis    Past Surgical History:  Procedure Laterality Date   NO PAST SURGERIES     Family History  Problem Relation Age of Onset   Diabetes Mother    Thyroid disease Mother        Questionable   Colon cancer Mother        Questionable   Heart attack Father    Deep vein thrombosis Father    Diabetes Sister    Breast cancer Neg Hx    Social History   Socioeconomic History   Marital status: Married    Spouse name: Not on file   Number of children: 3   Years of  education: Not on file   Highest education level: Not on file  Occupational History   Not on file  Social Needs   Financial resource strain: Not hard at all   Food insecurity:    Worry: Never true    Inability: Never true   Transportation needs:    Medical: No    Non-medical: No  Tobacco Use   Smoking status: Never Smoker   Smokeless tobacco: Never Used  Substance and Sexual Activity   Alcohol use: No    Alcohol/week: 0.0 standard drinks   Drug use: No   Sexual activity: Yes  Lifestyle   Physical activity:    Days per week: 5 days    Minutes per session: 30 min   Stress: Not at all  Relationships   Social connections:    Talks on phone: Not on file    Gets together: Not on file    Attends religious service: Not on file    Active member of club or organization: Not on file    Attends meetings of clubs or organizations: Not on file    Relationship status: Not on file  Other Topics Concern   Not on file  Social History Narrative   She is married and has 3 daughters.    Outpatient Encounter Medications as  of 11/06/2018  Medication Sig   Calcium Carbonate-Vitamin D 600-400 MG-UNIT per chew tablet Chew 1 tablet by mouth 2 (two) times daily.    Cholecalciferol 1000 UNITS capsule Take 1,000 Units by mouth daily.   clobetasol cream (TEMOVATE) 0.16 % Apply 1 application topically 2 (two) times a week.   denosumab (PROLIA) 60 MG/ML SOLN injection Inject 60 mg into the skin every 6 (six) months. Administer in upper arm, thigh, or abdomen   estradiol (ESTRACE) 0.1 MG/GM vaginal cream Place 2 g vaginally 3 (three) times a week.    ferrous sulfate 325 (65 FE) MG EC tablet Take 325 mg by mouth every other day.    rosuvastatin (CRESTOR) 10 MG tablet Take 1 tablet (10 mg total) by mouth daily.   No facility-administered encounter medications on file as of 11/06/2018.     Activities of Daily Living In your present state of health, do you have any difficulty  performing the following activities: 11/06/2018  Hearing? N  Vision? N  Difficulty concentrating or making decisions? N  Walking or climbing stairs? N  Dressing or bathing? N  Doing errands, shopping? N  Preparing Food and eating ? N  Using the Toilet? N  In the past six months, have you accidently leaked urine? N  Do you have problems with loss of bowel control? N  Managing your Medications? N  Managing your Finances? N  Housekeeping or managing your Housekeeping? N  Some recent data might be hidden    Patient Care Team: Einar Pheasant, MD as PCP - General (Internal Medicine)    Assessment:   This is a routine wellness examination for Sonya Hernandez.  I connected with patient 11/06/18 at  9:00 AM EDT by a video/audio enabled telemedicine application and verified that I am speaking with the correct person using two identifiers. Patient stated full name and DOB. Patient gave permission to continue with virtual visit. Patient's location was at home and Nurse's location was at [place] office.   Health Screenings  Mammogram - 09/2017 Colonoscopy - 05/2013 Bone Density - 03/2013 Glaucoma -none Hearing -demonstrates normal hearing during visit. Cholesterol - 06/2018 Dental- visits every 12 months. Vision- visits within the last 12 months.  Social  Alcohol intake - no          Smoking history- never    Smokers in home? none Illicit drug use? none Exercise - walking, clogging, stretching Diet - regular Sexually Active -yes BMI- discussed the importance of a healthy diet, water intake and the benefits of aerobic exercise.  Educational material provided.   Safety  Patient feels safe at home- yes Patient does have smoke detectors at home- yes Patient does wear sunscreen or protective clothing when in direct sunlight -yes Patient does wear seat belt when in a moving vehicle -yes  Covid-19 precautions and sickness symptoms discussed.   Activities of Daily Living Patient denies  needing assistance with: driving, household chores, feeding themselves, getting from bed to chair, getting to the toilet, bathing/showering, dressing, managing money, or preparing meals.  No new identified risk were noted.    Depression Screen Patient denies losing interest in daily life, feeling hopeless, or crying easily over simple problems.   Medication-taking as directed and without issues.   Fall Screen Patient denies being afraid of falling or falling in the last year.   Memory Screen Patient is alert.  Patient denies difficulty focusing, concentrating or misplacing items. Correctly identified the president of the [place], season and recall. Patient likes to  read and plays computer games for brain stimulation.  Immunizations The following Immunizations were discussed: Influenza, shingles, pneumonia, and tetanus.   Other Providers Patient Care Team: Einar Pheasant, MD as PCP - General (Internal Medicine)  Exercise Activities and Dietary recommendations Current Exercise Habits: Home exercise routine, Type of exercise: walking;stretching(clogging, dancing), Intensity: Mild  Goals     Follow up with Primary Care Provider     As needed       [place]  11/06/2018 10/31/2017 07/02/2017 10/25/2016 06/28/2016  Falls in the past year? 0 No Yes Yes No  Number falls in past yr: - - 1 2 or more -  Injury with Fall? - - Yes Yes -  Follow up - - - Falls prevention discussed;Education provided -   Depression Screen PHQ 2/9 Scores 11/06/2018 10/31/2017 07/02/2017 10/25/2016  PHQ - 2 Score 0 0 0 0  PHQ- 9 Score - - - 0     Cognitive Function MMSE - Mini Mental State Exam 10/31/2017 10/25/2016  Orientation to time 5 5  Orientation to Place 5 5  Registration 3 3  Attention/ Calculation 5 5  Recall 3 3  Language- name 2 objects 2 2  Language- repeat 1 1  Language- follow 3 step command 3 3  Language- read & follow direction 1 1  Write a sentence 1 1  Copy design 1 1  Total  score 30 30     6CIT Screen 11/06/2018  What Year? 0 points  What month? 0 points  What time? 0 points  Count back from 20 0 points  Months in reverse 0 points  Repeat phrase 0 points  Total Score 0   Immunization History  Administered Date(s) Administered   Influenza, High Dose Seasonal PF 06/07/2018   Influenza,inj,Quad PF,6+ Mos 06/25/2013, 06/25/2014, 06/28/2015   Influenza-Unspecified 06/14/2016   Pneumococcal Conjugate-13 06/25/2013   Pneumococcal Polysaccharide-23 06/28/2016   Zoster Recombinat (Shingrix) 05/16/2017, 08/28/2017   Screening Tests Health Maintenance  Topic Date Due   Hepatitis C Screening  Nov 03, 1945   TETANUS/TDAP  12/07/1962   INFLUENZA VACCINE  01/04/2019   MAMMOGRAM  09/19/2019   COLONOSCOPY  06/03/2023   DEXA SCAN  Completed   PNA vac Low Risk Adult  Completed      Plan:    End of life planning; Advance aging; Advanced directives discussed.  Copy of current HCPOA/Living Will requested.    I have personally reviewed and noted the following in the patients chart:    Medical and social history  Use of alcohol, tobacco or illicit drugs   Current medications and supplements  Functional ability and status  Nutritional status  Physical activity  Advanced directives  List of other physicians  Hospitalizations, surgeries, and ER visits in previous 12 months  Vitals  Screenings to include cognitive, depression, and falls  Referrals and appointments  In addition, I have reviewed and discussed with patient certain preventive protocols, quality metrics, and best practice recommendations. A written personalized care plan for preventive services as well as general preventive health recommendations were provided to patient.     Varney Biles, LPN  12/04/5364   Reviewed above information.  Agree with assessment and plan.   Dr Nicki Reaper

## 2018-11-21 ENCOUNTER — Other Ambulatory Visit: Payer: Self-pay | Admitting: Internal Medicine

## 2018-11-21 DIAGNOSIS — Z1231 Encounter for screening mammogram for malignant neoplasm of breast: Secondary | ICD-10-CM

## 2019-01-01 ENCOUNTER — Ambulatory Visit
Admission: RE | Admit: 2019-01-01 | Discharge: 2019-01-01 | Disposition: A | Discharge status: Home / Self Care | Payer: Medicare Other | Source: Ambulatory Visit | Attending: Internal Medicine | Admitting: Internal Medicine

## 2019-01-01 ENCOUNTER — Other Ambulatory Visit: Payer: Self-pay

## 2019-01-01 DIAGNOSIS — Z1231 Encounter for screening mammogram for malignant neoplasm of breast: Secondary | ICD-10-CM | POA: Diagnosis not present

## 2019-02-01 ENCOUNTER — Other Ambulatory Visit: Payer: Self-pay | Admitting: Internal Medicine

## 2019-02-13 DIAGNOSIS — C441191 Basal cell carcinoma of skin of left upper eyelid, including canthus: Secondary | ICD-10-CM | POA: Diagnosis not present

## 2019-02-13 DIAGNOSIS — L578 Other skin changes due to chronic exposure to nonionizing radiation: Secondary | ICD-10-CM | POA: Diagnosis not present

## 2019-02-13 DIAGNOSIS — D485 Neoplasm of uncertain behavior of skin: Secondary | ICD-10-CM | POA: Diagnosis not present

## 2019-02-13 DIAGNOSIS — L821 Other seborrheic keratosis: Secondary | ICD-10-CM | POA: Diagnosis not present

## 2019-02-17 ENCOUNTER — Ambulatory Visit (INDEPENDENT_AMBULATORY_CARE_PROVIDER_SITE_OTHER): Payer: Medicare Other | Admitting: Internal Medicine

## 2019-02-17 ENCOUNTER — Other Ambulatory Visit: Payer: Self-pay

## 2019-02-17 VITALS — BP 110/70 | HR 59 | Temp 97.5°F | Resp 16 | Wt 127.4 lb

## 2019-02-17 DIAGNOSIS — D649 Anemia, unspecified: Secondary | ICD-10-CM | POA: Diagnosis not present

## 2019-02-17 DIAGNOSIS — Z1159 Encounter for screening for other viral diseases: Secondary | ICD-10-CM

## 2019-02-17 DIAGNOSIS — M810 Age-related osteoporosis without current pathological fracture: Secondary | ICD-10-CM

## 2019-02-17 DIAGNOSIS — Z0000 Encounter for general adult medical examination without abnormal findings: Secondary | ICD-10-CM | POA: Diagnosis not present

## 2019-02-17 DIAGNOSIS — E7800 Pure hypercholesterolemia, unspecified: Secondary | ICD-10-CM | POA: Diagnosis not present

## 2019-02-17 LAB — COMPREHENSIVE METABOLIC PANEL
ALT: 21 U/L (ref 0–35)
AST: 24 U/L (ref 0–37)
Albumin: 4 g/dL (ref 3.5–5.2)
Alkaline Phosphatase: 49 U/L (ref 39–117)
BUN: 14 mg/dL (ref 6–23)
CO2: 29 mEq/L (ref 19–32)
Calcium: 9.2 mg/dL (ref 8.4–10.5)
Chloride: 106 mEq/L (ref 96–112)
Creatinine, Ser: 0.75 mg/dL (ref 0.40–1.20)
GFR: 75.29 mL/min (ref >60.00)
Glucose, Bld: 94 mg/dL (ref 70–99)
Potassium: 4.1 mEq/L (ref 3.5–5.1)
Sodium: 141 mEq/L (ref 135–145)
Total Bilirubin: 0.5 mg/dL (ref 0.2–1.2)
Total Protein: 6.1 g/dL (ref 6.0–8.3)

## 2019-02-17 LAB — CBC WITH DIFFERENTIAL/PLATELET
Basophils Absolute: 0 10*3/uL (ref 0.0–0.1)
Basophils Relative: 0.3 % (ref 0.0–3.0)
Eosinophils Absolute: 0.2 10*3/uL (ref 0.0–0.7)
Eosinophils Relative: 4.1 % (ref 0.0–5.0)
HCT: 39.7 % (ref 36.0–46.0)
Hemoglobin: 13.4 g/dL (ref 12.0–15.0)
Lymphocytes Relative: 43.1 % (ref 12.0–46.0)
Lymphs Abs: 2.2 10*3/uL (ref 0.7–4.0)
MCHC: 33.9 g/dL (ref 30.0–36.0)
MCV: 94.2 fl (ref 78.0–100.0)
Monocytes Absolute: 0.3 10*3/uL (ref 0.1–1.0)
Monocytes Relative: 6.6 % (ref 3.0–12.0)
Neutro Abs: 2.3 10*3/uL (ref 1.4–7.7)
Neutrophils Relative %: 45.9 % (ref 43.0–77.0)
Platelets: 250 10*3/uL (ref 150.0–400.0)
RBC: 4.22 Mil/uL (ref 3.87–5.11)
RDW: 12.7 % (ref 11.5–15.5)
WBC: 5.1 10*3/uL (ref 4.0–10.5)

## 2019-02-17 LAB — LIPID PANEL
Cholesterol: 178 mg/dL (ref 0–200)
HDL: 52.5 mg/dL (ref >39.00)
LDL Cholesterol: 108 mg/dL — ABNORMAL HIGH (ref 0–99)
NonHDL: 125.57
Total CHOL/HDL Ratio: 3
Triglycerides: 87 mg/dL (ref 0.0–149.0)
VLDL: 17.4 mg/dL (ref 0.0–40.0)

## 2019-02-17 NOTE — Assessment & Plan Note (Addendum)
Physical today 02/17/19.  PAP 06/2012 - negative with negative HPV.  Age 73 - no pap today.  Mammogram 01/01/19- Birads I.  Due colonoscopy.  Refer back to GI.  Discussed flu shot.  Check hepatitis c.

## 2019-02-17 NOTE — Progress Notes (Signed)
Patient ID: Sonya Hernandez, female   DOB: [birth year], 73 y.o.   MRN: [id number]   Subjective:    Patient ID: Sonya Hernandez, female    DOB: [birth year], 73 y.o.   MRN: [id number]  HPI  Patient here for her physical exam.  She is doing well.  Feels good.  Is exercising.  No chest pain. No sob.  No acid reflux.  No abdominal pain.  Bowels moving. Receiving prolia.  Discussed due colonoscopy.     Past Medical History:  Diagnosis Date  . Anemia   . Osteoporosis    Past Surgical History:  Procedure Laterality Date  . NO PAST SURGERIES     Family History  Problem Relation Age of Onset  . Diabetes Mother   . Thyroid disease Mother        Questionable  . Colon cancer Mother        Questionable  . Heart attack Father   . Deep vein thrombosis Father   . Diabetes Sister   . Breast cancer Neg Hx    Social History   Socioeconomic History  . Marital status: Married    Spouse name: Not on file  . Number of children: 3  . Years of education: Not on file  . Highest education level: Not on file  Occupational History  . Not on file  Social Needs  . Financial resource strain: Not hard at all  . Food insecurity    Worry: Never true    Inability: Never true  . Transportation needs    Medical: No    Non-medical: No  Tobacco Use  . Smoking status: Never Smoker  . Smokeless tobacco: Never Used  Substance and Sexual Activity  . Alcohol use: No    Alcohol/week: 0.0 standard drinks  . Drug use: No  . Sexual activity: Yes  Lifestyle  . Physical activity    Days per week: 5 days    Minutes per session: 30 min  . Stress: Not at all  Relationships  . Social Herbalist on phone: Not on file    Gets together: Not on file    Attends religious service: Not on file    Active member of club or organization: Not on file    Attends meetings of clubs or organizations: Not on file    Relationship status: Not on file  Other Topics Concern  . Not on file  Social History  Narrative   She is married and has 3 daughters.    Outpatient Encounter Medications as of 02/17/2019  Medication Sig  . Calcium Carbonate-Vitamin D 600-400 MG-UNIT per chew tablet Chew 1 tablet by mouth 2 (two) times daily.   . Cholecalciferol 1000 UNITS capsule Take 1,000 Units by mouth daily.  . clobetasol cream (TEMOVATE) [id number] % Apply 1 application topically 2 (two) times a week.  . denosumab (PROLIA) 60 MG/ML SOLN injection Inject 60 mg into the skin every 6 (six) months. Administer in upper arm, thigh, or abdomen  . estradiol (ESTRACE) 0.1 MG/GM vaginal cream Place 2 g vaginally 3 (three) times a week.   . ferrous sulfate 325 (65 FE) MG EC tablet Take 325 mg by mouth every other day.   . rosuvastatin (CRESTOR) 10 MG tablet Take 1 tablet (10 mg total) by mouth daily.   No facility-administered encounter medications on file as of 02/17/2019.     Review of Systems  Constitutional: Negative for appetite change and unexpected weight change.  HENT:  Negative for congestion and sinus pressure.   Eyes: Negative for pain and visual disturbance.  Respiratory: Negative for cough, chest tightness and shortness of breath.   Cardiovascular: Negative for chest pain, palpitations and leg swelling.  Gastrointestinal: Negative for abdominal pain, diarrhea, nausea and vomiting.  Genitourinary: Negative for difficulty urinating and dysuria.  Musculoskeletal: Negative for joint swelling and myalgias.  Skin: Negative for color change and rash.  Neurological: Negative for dizziness, light-headedness and headaches.  Hematological: Negative for adenopathy. Does not bruise/bleed easily.  Psychiatric/Behavioral: Negative for agitation and dysphoric mood.       Objective:    Physical Exam Constitutional:      General: She is not in acute distress.    Appearance: Normal appearance. She is well-developed.  HENT:     Right Ear: External ear normal.     Left Ear: External ear normal.  Eyes:     General:  No scleral icterus.       Right eye: No discharge.        Left eye: No discharge.     Conjunctiva/sclera: Conjunctivae normal.  Neck:     Musculoskeletal: Neck supple. No muscular tenderness.     Thyroid: No thyromegaly.  Cardiovascular:     Rate and Rhythm: Normal rate and regular rhythm.  Pulmonary:     Effort: No tachypnea, accessory muscle usage or respiratory distress.     Breath sounds: Normal breath sounds. No decreased breath sounds or wheezing.  Chest:     Breasts:        Right: No inverted nipple, mass, nipple discharge or tenderness (no axillary adenopathy).        Left: No inverted nipple, mass, nipple discharge or tenderness (no axilarry adenopathy).  Abdominal:     General: Bowel sounds are normal.     Palpations: Abdomen is soft.     Tenderness: There is no abdominal tenderness.  Musculoskeletal:        General: No swelling or tenderness.  Lymphadenopathy:     Cervical: No cervical adenopathy.  Skin:    Findings: No erythema or rash.  Neurological:     Mental Status: She is alert and oriented to person, place, and time.  Psychiatric:        Mood and Affect: Mood normal.        Behavior: Behavior normal.     BP 110/70   Pulse (!) 59   Temp (!) 97.5 F (36.4 C)   Resp 16   Wt 127 lb 6.4 oz (57.8 kg)   LMP 06/24/1993   SpO2 97%   BMI 21.20 kg/m  Wt Readings from Last 3 Encounters:  02/17/19 127 lb 6.4 oz (57.8 kg)  06/07/18 123 lb 6.4 oz (56 kg)  10/31/17 131 lb 12.8 oz (59.8 kg)     Lab Results  Component Value Date   WBC 5.1 02/17/2019   HGB 13.4 02/17/2019   HCT 39.7 02/17/2019   PLT 250.0 02/17/2019   GLUCOSE 94 02/17/2019   CHOL 178 02/17/2019   TRIG 87.0 02/17/2019   HDL 52.50 02/17/2019   LDLDIRECT 157.7 06/24/2012   LDLCALC 108 (H) 02/17/2019   ALT 21 02/17/2019   AST 24 02/17/2019   NA 141 02/17/2019   K 4.1 02/17/2019   CL 106 02/17/2019   CREATININE 0.75 02/17/2019   BUN 14 02/17/2019   CO2 29 02/17/2019   TSH 3.05  06/07/2018    Mm 3d Screen Breast Bilateral  Result Date: 01/01/2019 CLINICAL DATA:  Screening. EXAM: DIGITAL SCREENING BILATERAL MAMMOGRAM WITH TOMO AND CAD COMPARISON:  Previous exam(s). ACR Breast Density Category b: There are scattered areas of fibroglandular density. FINDINGS: There are no findings suspicious for malignancy. Images were processed with CAD. IMPRESSION: No mammographic evidence of malignancy. A result letter of this screening mammogram will be mailed directly to the patient. RECOMMENDATION: Screening mammogram in one year. (Code:SM-B-01Y) BI-RADS CATEGORY  1: Negative. Electronically Signed   By: Fidela Salisbury M.D.   On: 01/01/2019 10:12       Assessment & Plan:   Problem List Items Addressed This Visit    Anemia    Follow cbc.       Health care maintenance    Physical today 02/17/19.  PAP 06/2012 - negative with negative HPV.  Age 83 - no pap today.  Mammogram 01/01/19- Birads I.  Due colonoscopy.  Refer back to GI.  Discussed flu shot.  Check hepatitis c.        Hypercholesteremia    Follow lipid panel and liver function tests.  On crestor.        Relevant Orders   CBC with Differential/Platelet (Completed)   Comprehensive metabolic panel (Completed)   Lipid panel (Completed)   Osteoporosis - Primary    Receiving prolia.  Followed by endocrinology.        Other Visit Diagnoses    Need for hepatitis C screening test       Relevant Orders   Hepatitis C antibody       Einar Pheasant, MD

## 2019-02-18 ENCOUNTER — Encounter: Payer: Self-pay | Admitting: Internal Medicine

## 2019-02-18 LAB — HEPATITIS C ANTIBODY
Hepatitis C Ab: NONREACTIVE
SIGNAL TO CUT-OFF: 0.01 (ref <1.00)

## 2019-02-18 NOTE — Assessment & Plan Note (Signed)
Receiving prolia.  Followed by endocrinology.

## 2019-02-18 NOTE — Assessment & Plan Note (Signed)
Follow cbc.  

## 2019-02-18 NOTE — Assessment & Plan Note (Addendum)
Follow lipid panel and liver function tests.  On crestor.  

## 2019-03-03 ENCOUNTER — Encounter: Payer: Self-pay | Admitting: Internal Medicine

## 2019-03-25 NOTE — Telephone Encounter (Signed)
Signing note, rx for crestor d/c'd on 07/03/17

## 2019-04-10 DIAGNOSIS — C44310 Basal cell carcinoma of skin of unspecified parts of face: Secondary | ICD-10-CM | POA: Diagnosis not present

## 2019-04-10 DIAGNOSIS — L570 Actinic keratosis: Secondary | ICD-10-CM | POA: Diagnosis not present

## 2019-04-10 DIAGNOSIS — L578 Other skin changes due to chronic exposure to nonionizing radiation: Secondary | ICD-10-CM | POA: Diagnosis not present

## 2019-05-05 DIAGNOSIS — M810 Age-related osteoporosis without current pathological fracture: Secondary | ICD-10-CM | POA: Diagnosis not present

## 2019-05-07 DIAGNOSIS — C44319 Basal cell carcinoma of skin of other parts of face: Secondary | ICD-10-CM | POA: Diagnosis not present

## 2019-06-11 DIAGNOSIS — D485 Neoplasm of uncertain behavior of skin: Secondary | ICD-10-CM | POA: Diagnosis not present

## 2019-06-11 DIAGNOSIS — C44519 Basal cell carcinoma of skin of other part of trunk: Secondary | ICD-10-CM | POA: Diagnosis not present

## 2019-06-11 DIAGNOSIS — Z85828 Personal history of other malignant neoplasm of skin: Secondary | ICD-10-CM | POA: Diagnosis not present

## 2019-06-11 DIAGNOSIS — L578 Other skin changes due to chronic exposure to nonionizing radiation: Secondary | ICD-10-CM | POA: Diagnosis not present

## 2019-06-11 DIAGNOSIS — L814 Other melanin hyperpigmentation: Secondary | ICD-10-CM | POA: Diagnosis not present

## 2019-07-02 DIAGNOSIS — C44519 Basal cell carcinoma of skin of other part of trunk: Secondary | ICD-10-CM | POA: Diagnosis not present

## 2019-09-10 DIAGNOSIS — D485 Neoplasm of uncertain behavior of skin: Secondary | ICD-10-CM | POA: Diagnosis not present

## 2019-09-10 DIAGNOSIS — L814 Other melanin hyperpigmentation: Secondary | ICD-10-CM | POA: Diagnosis not present

## 2019-09-10 DIAGNOSIS — D2272 Melanocytic nevi of left lower limb, including hip: Secondary | ICD-10-CM | POA: Diagnosis not present

## 2019-09-10 DIAGNOSIS — D229 Melanocytic nevi, unspecified: Secondary | ICD-10-CM | POA: Diagnosis not present

## 2019-09-10 DIAGNOSIS — C44612 Basal cell carcinoma of skin of right upper limb, including shoulder: Secondary | ICD-10-CM | POA: Diagnosis not present

## 2019-09-10 DIAGNOSIS — Z85828 Personal history of other malignant neoplasm of skin: Secondary | ICD-10-CM | POA: Diagnosis not present

## 2019-09-17 DIAGNOSIS — Z01818 Encounter for other preprocedural examination: Secondary | ICD-10-CM | POA: Diagnosis not present

## 2019-09-17 DIAGNOSIS — K219 Gastro-esophageal reflux disease without esophagitis: Secondary | ICD-10-CM | POA: Diagnosis not present

## 2019-09-17 DIAGNOSIS — Z800 Family history of malignant neoplasm of digestive organs: Secondary | ICD-10-CM | POA: Diagnosis not present

## 2019-10-08 DIAGNOSIS — M9905 Segmental and somatic dysfunction of pelvic region: Secondary | ICD-10-CM | POA: Diagnosis not present

## 2019-10-08 DIAGNOSIS — M955 Acquired deformity of pelvis: Secondary | ICD-10-CM | POA: Diagnosis not present

## 2019-10-08 DIAGNOSIS — M5136 Other intervertebral disc degeneration, lumbar region: Secondary | ICD-10-CM | POA: Diagnosis not present

## 2019-10-08 DIAGNOSIS — M9903 Segmental and somatic dysfunction of lumbar region: Secondary | ICD-10-CM | POA: Diagnosis not present

## 2019-10-13 DIAGNOSIS — M5136 Other intervertebral disc degeneration, lumbar region: Secondary | ICD-10-CM | POA: Diagnosis not present

## 2019-10-13 DIAGNOSIS — M9903 Segmental and somatic dysfunction of lumbar region: Secondary | ICD-10-CM | POA: Diagnosis not present

## 2019-10-13 DIAGNOSIS — M9905 Segmental and somatic dysfunction of pelvic region: Secondary | ICD-10-CM | POA: Diagnosis not present

## 2019-10-13 DIAGNOSIS — M955 Acquired deformity of pelvis: Secondary | ICD-10-CM | POA: Diagnosis not present

## 2019-10-15 DIAGNOSIS — M9905 Segmental and somatic dysfunction of pelvic region: Secondary | ICD-10-CM | POA: Diagnosis not present

## 2019-10-15 DIAGNOSIS — M955 Acquired deformity of pelvis: Secondary | ICD-10-CM | POA: Diagnosis not present

## 2019-10-15 DIAGNOSIS — M9903 Segmental and somatic dysfunction of lumbar region: Secondary | ICD-10-CM | POA: Diagnosis not present

## 2019-10-15 DIAGNOSIS — C44519 Basal cell carcinoma of skin of other part of trunk: Secondary | ICD-10-CM | POA: Diagnosis not present

## 2019-10-15 DIAGNOSIS — M5136 Other intervertebral disc degeneration, lumbar region: Secondary | ICD-10-CM | POA: Diagnosis not present

## 2019-10-15 DIAGNOSIS — Z85828 Personal history of other malignant neoplasm of skin: Secondary | ICD-10-CM | POA: Diagnosis not present

## 2019-10-22 DIAGNOSIS — M9903 Segmental and somatic dysfunction of lumbar region: Secondary | ICD-10-CM | POA: Diagnosis not present

## 2019-10-22 DIAGNOSIS — M5136 Other intervertebral disc degeneration, lumbar region: Secondary | ICD-10-CM | POA: Diagnosis not present

## 2019-10-22 DIAGNOSIS — M955 Acquired deformity of pelvis: Secondary | ICD-10-CM | POA: Diagnosis not present

## 2019-10-22 DIAGNOSIS — M9905 Segmental and somatic dysfunction of pelvic region: Secondary | ICD-10-CM | POA: Diagnosis not present

## 2019-10-24 DIAGNOSIS — M810 Age-related osteoporosis without current pathological fracture: Secondary | ICD-10-CM | POA: Diagnosis not present

## 2019-10-29 DIAGNOSIS — Z85828 Personal history of other malignant neoplasm of skin: Secondary | ICD-10-CM | POA: Diagnosis not present

## 2019-10-29 DIAGNOSIS — C44612 Basal cell carcinoma of skin of right upper limb, including shoulder: Secondary | ICD-10-CM | POA: Diagnosis not present

## 2019-10-29 DIAGNOSIS — Z7189 Other specified counseling: Secondary | ICD-10-CM | POA: Diagnosis not present

## 2019-10-31 DIAGNOSIS — M810 Age-related osteoporosis without current pathological fracture: Secondary | ICD-10-CM | POA: Diagnosis not present

## 2019-11-05 DIAGNOSIS — M810 Age-related osteoporosis without current pathological fracture: Secondary | ICD-10-CM | POA: Diagnosis not present

## 2019-11-07 ENCOUNTER — Ambulatory Visit: Payer: Medicare Other

## 2019-11-11 DIAGNOSIS — Z01818 Encounter for other preprocedural examination: Secondary | ICD-10-CM | POA: Diagnosis not present

## 2019-11-13 ENCOUNTER — Ambulatory Visit (INDEPENDENT_AMBULATORY_CARE_PROVIDER_SITE_OTHER): Payer: Medicare Other

## 2019-11-13 VITALS — Ht 65.0 in | Wt 127.0 lb

## 2019-11-13 DIAGNOSIS — Z0000 Encounter for general adult medical examination without abnormal findings: Secondary | ICD-10-CM

## 2019-11-13 NOTE — Progress Notes (Addendum)
Subjective:   Sonya Hernandez is a 74 y.o. female who presents for Medicare Annual (Subsequent) preventive examination.  Review of Systems:  No ROS.  Medicare Wellness Virtual Visit.   Cardiac Risk Factors include: advanced age (>1men, >38 women)     Objective:     Vitals: Ht 5\' 5"  (1.651 m)   Wt 127 lb (57.6 kg)   LMP 06/24/1993   BMI 21.13 kg/m   Body mass index is 21.13 kg/m.  Advanced Directives 11/13/2019 11/06/2018 10/31/2017 10/25/2016  Does Patient Have a Medical Advance Directive? Yes Yes Yes Yes  Type of Paramedic of [place];Living will [place];Living will [place];Living will [place];Living will  Does patient want to make changes to medical advance directive? No - Patient declined No - Patient declined No - Patient declined No - Patient declined  Copy of [place] in Chart? No - copy requested No - copy requested No - copy requested No - copy requested    Tobacco Social History   Tobacco Use  Smoking Status Never Smoker  Smokeless Tobacco Never Used     Counseling given: Not Answered   Clinical Intake:  Pre-visit preparation completed: Yes        Diabetes: No  How often do you need to have someone help you when you read instructions, pamphlets, or other written materials from your doctor or pharmacy?: 1 - Never  Interpreter Needed?: No     Past Medical History:  Diagnosis Date  . Anemia   . Osteoporosis    Past Surgical History:  Procedure Laterality Date  . NO PAST SURGERIES     Family History  Problem Relation Age of Onset  . Diabetes Mother   . Thyroid disease Mother        Questionable  . Colon cancer Mother        Questionable  . Heart attack Father   . Deep vein thrombosis Father   . Diabetes Sister   . Breast cancer Neg Hx    Social History   Socioeconomic History  . Marital status: Married    Spouse name: Not on  file  . Number of children: 3  . Years of education: Not on file  . Highest education level: Not on file  Occupational History  . Not on file  Tobacco Use  . Smoking status: Never Smoker  . Smokeless tobacco: Never Used  Vaping Use  . Vaping Use: Never used  Substance and Sexual Activity  . Alcohol use: No    Alcohol/week: 0.0 standard drinks  . Drug use: No  . Sexual activity: Yes  Other Topics Concern  . Not on file  Social History Narrative   She is married and has 3 daughters.   Social Determinants of Health   Financial Resource Strain:   . Difficulty of Paying Living Expenses:   Food Insecurity:   . Worried About Charity fundraiser in the Last Year:   . Arboriculturist in the Last Year:   Transportation Needs:   . Film/video editor (Medical):   Marland Kitchen Lack of Transportation (Non-Medical):   Physical Activity:   . Days of Exercise per Week:   . Minutes of Exercise per Session:   Stress:   . Feeling of Stress :   Social Connections: Unknown  . Frequency of Communication with Friends and Family: More than three times a week  . Frequency of  Social Gatherings with Friends and Family: More than three times a week  . Attends Religious Services: Not on file  . Active Member of Clubs or Organizations: Not on file  . Attends Archivist Meetings: Not on file  . Marital Status: Married    Outpatient Encounter Medications as of 11/13/2019  Medication Sig  . Calcium Carbonate-Vitamin D 600-400 MG-UNIT per chew tablet Chew 1 tablet by mouth 2 (two) times daily.   . Cholecalciferol 1000 UNITS capsule Take 1,000 Units by mouth daily.  . clobetasol cream (TEMOVATE) 8.93 % Apply 1 application topically 2 (two) times a week.  . denosumab (PROLIA) 60 MG/ML SOLN injection Inject 60 mg into the skin every 6 (six) months. Administer in upper arm, thigh, or abdomen  . estradiol (ESTRACE) 0.1 MG/GM vaginal cream Place 2 g vaginally 3 (three) times a week.   . ferrous sulfate  325 (65 FE) MG EC tablet Take 325 mg by mouth every other day.   . rosuvastatin (CRESTOR) 10 MG tablet Take 1 tablet (10 mg total) by mouth daily.   No facility-administered encounter medications on file as of 11/13/2019.    Activities of Daily Living In your present state of health, do you have any difficulty performing the following activities: 11/13/2019  Hearing? N  Vision? N  Difficulty concentrating or making decisions? N  Walking or climbing stairs? N  Dressing or bathing? N  Doing errands, shopping? N  Preparing Food and eating ? N  Using the Toilet? N  In the past six months, have you accidently leaked urine? N  Do you have problems with loss of bowel control? N  Managing your Medications? N  Managing your Finances? N  Housekeeping or managing your Housekeeping? N  Some recent data might be hidden    Patient Care Team: Einar Pheasant, MD as PCP - General (Internal Medicine)    Assessment:   This is a routine wellness examination for Sonya Hernandez.  I connected with Serrena today by telephone and verified that I am speaking with the correct person using two identifiers. Location patient: home Location provider: work Persons participating in the virtual visit: patient, Marine scientist.    I discussed the limitations, risks, security and privacy concerns of performing an evaluation and management service by telephone and the availability of in person appointments. The patient expressed understanding and verbally consented to this telephonic visit.    Interactive audio and video telecommunications were attempted between this provider and patient, however failed, due to patient having technical difficulties OR patient did not have access to video capability.  We continued and completed visit with audio only.  Some vital signs may be absent or patient reported.   Health Maintenance Due: -Tdap vaccine- discussed; to be completed with doctor in visit or local pharmacy.   See completed  HM at the end of note.   Eye: Visual acuity not assessed. Virtual visit. Followed by their ophthalmologist. Cataracts extracted, bilateral. Wears readers only.   Dental: Visits every 12 months.    Hearing: Demonstrates normal hearing during visit.  Safety:  Patient feels safe at home- yes Patient does have smoke detectors at home- yes Patient does wear sunscreen or protective clothing when in direct sunlight - yes Patient does wear seat belt when in a moving vehicle - yes Patient drives- yes Adequate lighting in walkways free from debris- yes Grab bars and handrails used as appropriate- yes Ambulates with an assistive device- no  Social: Alcohol intake - no  Smoking history- never   Smokers in home? none Illicit drug use? none  Medication: Taking as directed and without issues.  Self managed - yes   Covid-19: Precautions and sickness symptoms discussed. Wears mask, social distancing, hand hygiene as appropriate.   Activities of Daily Living Patient denies needing assistance with: household chores, feeding themselves, getting from bed to chair, getting to the toilet, bathing/showering, dressing, managing money, or preparing meals.   Discussed the importance of a healthy diet, water intake and the benefits of aerobic exercise.   Physical activity- [place] aerobic class 1x weekly 30-60 minutes, walking 5x weekly about 42 minutes covering 2 miles.   Diet:  Regular Water: fair intake; primary drink is tea. Encouraged to stay hydrated.   Other Providers Patient Care Team: Einar Pheasant, MD as PCP - General (Internal Medicine)  Exercise Activities and Dietary recommendations Current Exercise Habits: Home exercise routine, Intensity: Moderate  Goals    . Follow up with Primary Care Provider     As needed       Fall Risk Fall Risk  11/13/2019 02/17/2019 11/06/2018 10/31/2017 07/02/2017  Falls in the past year? 0 0 0 No Yes  Number falls in past yr: 0 - - - 1    Injury with Fall? - - - - Yes  Follow up Falls evaluation completed Falls evaluation completed - - -   Is the patient's home free of loose throw rugs in walkways, pet beds, electrical cords, etc?  Yes      Grab bars in the bathroom? Yes      Handrails on the stairs?   Yes      Adequate lighting?  Yes  Timed Get Up and Go performed: No, virtual visit  Depression Screen PHQ 2/9 Scores 11/13/2019 02/17/2019 11/06/2018 10/31/2017  PHQ - 2 Score 0 0 0 0  PHQ- 9 Score - - - -     Cognitive Function Patient is alert and oriented x3. Patient denies difficulty focusing or concentrating. Patient likes to read, play brain challenging computer games, complete puzzles, homes schools grandchild and crotchets for brain health.   MMSE - Mini Mental State Exam 11/13/2019 10/31/2017 10/25/2016  Not completed: Unable to complete - -  Orientation to time - 5 5  Orientation to Place - 5 5  Registration - 3 3  Attention/ Calculation - 5 5  Recall - 3 3  Language- name 2 objects - 2 2  Language- repeat - 1 1  Language- follow 3 step command - 3 3  Language- read & follow direction - 1 1  Write a sentence - 1 1  Copy design - 1 1  Total score - 30 30     6CIT Screen 11/06/2018  What Year? 0 points  What month? 0 points  What time? 0 points  Count back from 20 0 points  Months in reverse 0 points  Repeat phrase 0 points  Total Score 0    Immunization History  Administered Date(s) Administered  . Influenza, High Dose Seasonal PF 06/07/2018  . Influenza,inj,Quad PF,6+ Mos 06/25/2013, 06/25/2014, 06/28/2015  . Influenza-Unspecified 06/14/2016, 02/26/2019  . PFIZER SARS-COV-2 Vaccination 06/12/2019, 07/03/2019  . Pneumococcal Conjugate-13 06/25/2013  . Pneumococcal Polysaccharide-23 06/28/2016  . Zoster Recombinat (Shingrix) 05/16/2017, 08/28/2017   Screening Tests Health Maintenance  Topic Date Due  . TETANUS/TDAP  Never done  . INFLUENZA VACCINE  01/04/2020  . COLONOSCOPY  06/03/2023  .  DEXA SCAN  Completed  . COVID-19 Vaccine  Completed  .  Hepatitis C Screening  Completed  . PNA vac Low Risk Adult  Completed    Cancer Screenings: Lung: Low Dose CT Chest recommended if Age 20-80 years, 30 pack-year currently smoking OR have quit w/in 15years. Patient does not qualify.     Plan:   Keep all routine maintenance appointments.   Follow up 02/18/20 @ 8:00  Medicare Attestation I have personally reviewed: The patient's medical and social history Their use of alcohol, tobacco or illicit drugs Their current medications and supplements The patient's functional ability including ADLs,fall risks, home safety risks, cognitive, and hearing and visual impairment Diet and physical activities Evidence for depression   I have reviewed and discussed with patient certain preventive protocols, quality metrics, and best practice recommendations.      Varney Biles, LPN  01/22/8137   Reviewed above information.  Agree with assessment and plan.    Dr Nicki Reaper

## 2019-11-13 NOTE — Patient Instructions (Addendum)
  Sonya Hernandez , Thank you for taking time to come for your Medicare Wellness Visit. I appreciate your ongoing commitment to your health goals. Please review the following plan we discussed and let me know if I can assist you in the future.   These are the goals we discussed: Goals    . Follow up with Primary Care Provider     As needed       This is a list of the screening recommended for you and due dates:  Health Maintenance  Topic Date Due  . Tetanus Vaccine  Never done  . Flu Shot  01/04/2020  . Colon Cancer Screening  06/03/2023  . DEXA scan (bone density measurement)  Completed  . COVID-19 Vaccine  Completed  .  Hepatitis C: One time screening is recommended by Center for Disease Control  (CDC) for  adults born from [birth year] through 1967.   Completed  . Pneumonia vaccines  Completed

## 2019-11-14 DIAGNOSIS — Z1211 Encounter for screening for malignant neoplasm of colon: Secondary | ICD-10-CM | POA: Diagnosis not present

## 2019-11-14 DIAGNOSIS — K640 First degree hemorrhoids: Secondary | ICD-10-CM | POA: Diagnosis not present

## 2019-11-14 DIAGNOSIS — K5730 Diverticulosis of large intestine without perforation or abscess without bleeding: Secondary | ICD-10-CM | POA: Diagnosis not present

## 2019-11-14 DIAGNOSIS — Z800 Family history of malignant neoplasm of digestive organs: Secondary | ICD-10-CM | POA: Diagnosis not present

## 2019-11-26 DIAGNOSIS — M9903 Segmental and somatic dysfunction of lumbar region: Secondary | ICD-10-CM | POA: Diagnosis not present

## 2019-11-26 DIAGNOSIS — M5136 Other intervertebral disc degeneration, lumbar region: Secondary | ICD-10-CM | POA: Diagnosis not present

## 2019-11-26 DIAGNOSIS — M955 Acquired deformity of pelvis: Secondary | ICD-10-CM | POA: Diagnosis not present

## 2019-11-26 DIAGNOSIS — M9905 Segmental and somatic dysfunction of pelvic region: Secondary | ICD-10-CM | POA: Diagnosis not present

## 2019-12-05 ENCOUNTER — Other Ambulatory Visit: Payer: Self-pay | Admitting: Internal Medicine

## 2019-12-05 DIAGNOSIS — Z1231 Encounter for screening mammogram for malignant neoplasm of breast: Secondary | ICD-10-CM

## 2019-12-11 DIAGNOSIS — Z85828 Personal history of other malignant neoplasm of skin: Secondary | ICD-10-CM | POA: Diagnosis not present

## 2019-12-11 DIAGNOSIS — B078 Other viral warts: Secondary | ICD-10-CM | POA: Diagnosis not present

## 2019-12-11 DIAGNOSIS — L814 Other melanin hyperpigmentation: Secondary | ICD-10-CM | POA: Diagnosis not present

## 2019-12-11 DIAGNOSIS — L821 Other seborrheic keratosis: Secondary | ICD-10-CM | POA: Diagnosis not present

## 2020-01-07 ENCOUNTER — Ambulatory Visit
Admission: RE | Admit: 2020-01-07 | Discharge: 2020-01-07 | Disposition: A | Discharge status: Home / Self Care | Payer: Medicare Other | Source: Ambulatory Visit | Attending: Internal Medicine | Admitting: Internal Medicine

## 2020-01-07 ENCOUNTER — Other Ambulatory Visit: Payer: Self-pay

## 2020-01-07 DIAGNOSIS — Z1231 Encounter for screening mammogram for malignant neoplasm of breast: Secondary | ICD-10-CM | POA: Diagnosis not present

## 2020-01-07 DIAGNOSIS — M9905 Segmental and somatic dysfunction of pelvic region: Secondary | ICD-10-CM | POA: Diagnosis not present

## 2020-01-07 DIAGNOSIS — M955 Acquired deformity of pelvis: Secondary | ICD-10-CM | POA: Diagnosis not present

## 2020-01-07 DIAGNOSIS — M9903 Segmental and somatic dysfunction of lumbar region: Secondary | ICD-10-CM | POA: Diagnosis not present

## 2020-01-07 DIAGNOSIS — M5136 Other intervertebral disc degeneration, lumbar region: Secondary | ICD-10-CM | POA: Diagnosis not present

## 2020-01-07 HISTORY — DX: Malignant (primary) neoplasm, unspecified: C80.1

## 2020-02-12 ENCOUNTER — Other Ambulatory Visit: Payer: Self-pay | Admitting: Internal Medicine

## 2020-02-17 DIAGNOSIS — M9903 Segmental and somatic dysfunction of lumbar region: Secondary | ICD-10-CM | POA: Diagnosis not present

## 2020-02-17 DIAGNOSIS — M9905 Segmental and somatic dysfunction of pelvic region: Secondary | ICD-10-CM | POA: Diagnosis not present

## 2020-02-17 DIAGNOSIS — M5136 Other intervertebral disc degeneration, lumbar region: Secondary | ICD-10-CM | POA: Diagnosis not present

## 2020-02-17 DIAGNOSIS — M955 Acquired deformity of pelvis: Secondary | ICD-10-CM | POA: Diagnosis not present

## 2020-02-18 ENCOUNTER — Ambulatory Visit (INDEPENDENT_AMBULATORY_CARE_PROVIDER_SITE_OTHER): Payer: Medicare Other | Admitting: Internal Medicine

## 2020-02-18 ENCOUNTER — Other Ambulatory Visit: Payer: Self-pay

## 2020-02-18 VITALS — BP 138/72 | HR 64 | Temp 98.2°F | Ht 65.0 in | Wt 127.8 lb

## 2020-02-18 DIAGNOSIS — M25561 Pain in right knee: Secondary | ICD-10-CM | POA: Diagnosis not present

## 2020-02-18 DIAGNOSIS — M810 Age-related osteoporosis without current pathological fracture: Secondary | ICD-10-CM | POA: Diagnosis not present

## 2020-02-18 DIAGNOSIS — Z0000 Encounter for general adult medical examination without abnormal findings: Secondary | ICD-10-CM

## 2020-02-18 DIAGNOSIS — Z23 Encounter for immunization: Secondary | ICD-10-CM | POA: Diagnosis not present

## 2020-02-18 DIAGNOSIS — E7800 Pure hypercholesterolemia, unspecified: Secondary | ICD-10-CM

## 2020-02-18 LAB — COMPREHENSIVE METABOLIC PANEL
ALT: 16 U/L (ref 0–35)
AST: 20 U/L (ref 0–37)
Albumin: 4.1 g/dL (ref 3.5–5.2)
Alkaline Phosphatase: 47 U/L (ref 39–117)
BUN: 14 mg/dL (ref 6–23)
CO2: 30 mEq/L (ref 19–32)
Calcium: 9.1 mg/dL (ref 8.4–10.5)
Chloride: 105 mEq/L (ref 96–112)
Creatinine, Ser: 0.79 mg/dL (ref 0.40–1.20)
GFR: 70.72 mL/min (ref >60.00)
Glucose, Bld: 93 mg/dL (ref 70–99)
Potassium: 3.9 mEq/L (ref 3.5–5.1)
Sodium: 140 mEq/L (ref 135–145)
Total Bilirubin: 0.6 mg/dL (ref 0.2–1.2)
Total Protein: 6.3 g/dL (ref 6.0–8.3)

## 2020-02-18 LAB — CBC WITH DIFFERENTIAL/PLATELET
Basophils Absolute: 0 10*3/uL (ref 0.0–0.1)
Basophils Relative: 0.3 % (ref 0.0–3.0)
Eosinophils Absolute: 0.4 10*3/uL (ref 0.0–0.7)
Eosinophils Relative: 7.6 % — ABNORMAL HIGH (ref 0.0–5.0)
HCT: 39.2 % (ref 36.0–46.0)
Hemoglobin: 13.2 g/dL (ref 12.0–15.0)
Lymphocytes Relative: 42.2 % (ref 12.0–46.0)
Lymphs Abs: 2.2 10*3/uL (ref 0.7–4.0)
MCHC: 33.6 g/dL (ref 30.0–36.0)
MCV: 94.5 fl (ref 78.0–100.0)
Monocytes Absolute: 0.4 10*3/uL (ref 0.1–1.0)
Monocytes Relative: 8.6 % (ref 3.0–12.0)
Neutro Abs: 2.1 10*3/uL (ref 1.4–7.7)
Neutrophils Relative %: 41.3 % — ABNORMAL LOW (ref 43.0–77.0)
Platelets: 190 10*3/uL (ref 150.0–400.0)
RBC: 4.15 Mil/uL (ref 3.87–5.11)
RDW: 12.9 % (ref 11.5–15.5)
WBC: 5.2 10*3/uL (ref 4.0–10.5)

## 2020-02-18 LAB — LIPID PANEL
Cholesterol: 134 mg/dL (ref 0–200)
HDL: 56.9 mg/dL (ref >39.00)
LDL Cholesterol: 67 mg/dL (ref 0–99)
NonHDL: 77.01
Total CHOL/HDL Ratio: 2
Triglycerides: 50 mg/dL (ref 0.0–149.0)
VLDL: 10 mg/dL (ref 0.0–40.0)

## 2020-02-18 LAB — TSH: TSH: 4.46 u[IU]/mL (ref 0.35–4.50)

## 2020-02-18 NOTE — Progress Notes (Signed)
Patient ID: Sonya Hernandez, female   DOB: January 01, 1946, 74 y.o.   MRN: [id number]   Subjective:    Patient ID: Sonya Hernandez, female    DOB: 1945-09-13, 74 y.o.   MRN: [id number]  HPI This visit occurred during the SARS-CoV-2 public health emergency.  Safety protocols were in place, including screening questions prior to the visit, additional usage of staff PPE, and extensive cleaning of exam room while observing appropriate contact time as indicated for disinfecting solutions.  Patient here for her physical exam.  Doing well.  Stays active.  Has noticed some right knee discomfort.  Intermittent flares.  Has had a few flares over the last several years.  She will rest it and ice it and then symptoms resolve.  Better now.  States if flares again, may call to see ortho.  No chest pain or sob.  No acid reflux or abdominal pain reported.  Bowels moving.  Had recent colonoscopy.  Need results.  States everything ok.    Past Medical History:  Diagnosis Date  . Anemia   . Cancer ([place])    skin  . Osteoporosis    Past Surgical History:  Procedure Laterality Date  . NO PAST SURGERIES     Family History  Problem Relation Age of Onset  . Diabetes Mother   . Thyroid disease Mother        Questionable  . Colon cancer Mother        Questionable  . Heart attack Father   . Deep vein thrombosis Father   . Diabetes Sister   . Breast cancer Neg Hx    Social History   Socioeconomic History  . Marital status: Married    Spouse name: Not on file  . Number of children: 3  . Years of education: Not on file  . Highest education level: Not on file  Occupational History  . Not on file  Tobacco Use  . Smoking status: Never Smoker  . Smokeless tobacco: Never Used  Vaping Use  . Vaping Use: Never used  Substance and Sexual Activity  . Alcohol use: No    Alcohol/week: 0.0 standard drinks  . Drug use: No  . Sexual activity: Yes  Other Topics Concern  . Not on file  Social History Narrative     She is married and has 3 daughters.   Social Determinants of Health   Financial Resource Strain:   . Difficulty of Paying Living Expenses: Not on file  Food Insecurity:   . Worried About Charity fundraiser in the Last Year: Not on file  . Ran Out of Food in the Last Year: Not on file  Transportation Needs:   . Lack of Transportation (Medical): Not on file  . Lack of Transportation (Non-Medical): Not on file  Physical Activity:   . Days of Exercise per Week: Not on file  . Minutes of Exercise per Session: Not on file  Stress:   . Feeling of Stress : Not on file  Social Connections: Unknown  . Frequency of Communication with Friends and Family: More than three times a week  . Frequency of Social Gatherings with Friends and Family: More than three times a week  . Attends Religious Services: Not on file  . Active Member of Clubs or Organizations: Not on file  . Attends Archivist Meetings: Not on file  . Marital Status: Married    Outpatient Encounter Medications as of 02/18/2020  Medication Sig  . Calcium  Carbonate-Vitamin D 600-400 MG-UNIT per chew tablet Chew 1 tablet by mouth 2 (two) times daily.   . Cholecalciferol 1000 UNITS capsule Take 1,000 Units by mouth daily.  . clobetasol cream (TEMOVATE) 3.78 % Apply 1 application topically 2 (two) times a week.  . denosumab (PROLIA) 60 MG/ML SOLN injection Inject 60 mg into the skin every 6 (six) months. Administer in upper arm, thigh, or abdomen  . estradiol (ESTRACE) 0.1 MG/GM vaginal cream Place 2 g vaginally 3 (three) times a week.   . ferrous sulfate 325 (65 FE) MG EC tablet Take 325 mg by mouth every other day.   . rosuvastatin (CRESTOR) 10 MG tablet Take 1 tablet (10 mg total) by mouth daily.   No facility-administered encounter medications on file as of 02/18/2020.    Review of Systems  Constitutional: Negative for appetite change and unexpected weight change.  HENT: Negative for congestion and sinus pressure.    Eyes: Negative for pain and visual disturbance.  Respiratory: Negative for cough, chest tightness and shortness of breath.   Cardiovascular: Negative for chest pain, palpitations and leg swelling.  Gastrointestinal: Negative for abdominal pain, diarrhea, nausea and vomiting.  Genitourinary: Negative for difficulty urinating and dysuria.  Musculoskeletal: Negative for myalgias.       Knee pain as outlined.   Skin: Negative for color change and rash.  Neurological: Negative for dizziness, light-headedness and headaches.  Hematological: Negative for adenopathy. Does not bruise/bleed easily.  Psychiatric/Behavioral: Negative for agitation and dysphoric mood.       Objective:    Physical Exam Vitals reviewed.  Constitutional:      General: She is not in acute distress.    Appearance: Normal appearance. She is well-developed.  HENT:     Head: Normocephalic and atraumatic.     Right Ear: External ear normal.     Left Ear: External ear normal.  Eyes:     General: No scleral icterus.       Right eye: No discharge.        Left eye: No discharge.     Conjunctiva/sclera: Conjunctivae normal.  Neck:     Thyroid: No thyromegaly.  Cardiovascular:     Rate and Rhythm: Normal rate and regular rhythm.  Pulmonary:     Effort: No tachypnea, accessory muscle usage or respiratory distress.     Breath sounds: Normal breath sounds. No decreased breath sounds or wheezing.  Chest:     Breasts:        Right: No inverted nipple, mass, nipple discharge or tenderness (no axillary adenopathy).        Left: No inverted nipple, mass, nipple discharge or tenderness (no axilarry adenopathy).  Abdominal:     General: Bowel sounds are normal.     Palpations: Abdomen is soft.     Tenderness: There is no abdominal tenderness.  Musculoskeletal:        General: No swelling or tenderness.     Cervical back: Neck supple. No tenderness.  Lymphadenopathy:     Cervical: No cervical adenopathy.  Skin:     Findings: No erythema or rash.  Neurological:     Mental Status: She is alert and oriented to person, place, and time.  Psychiatric:        Mood and Affect: Mood normal.        Behavior: Behavior normal.     BP 138/72 (BP Location: Left Arm, Patient Position: Sitting)   Pulse 64   Temp 98.2 F (36.8 C)  Ht 5\' 5"  (1.651 m)   Wt 127 lb 12.8 oz (58 kg)   LMP 06/24/1993   SpO2 95%   BMI 21.27 kg/m  Wt Readings from Last 3 Encounters:  02/18/20 127 lb 12.8 oz (58 kg)  11/13/19 127 lb (57.6 kg)  02/17/19 127 lb 6.4 oz (57.8 kg)     Lab Results  Component Value Date   WBC 5.2 02/18/2020   HGB 13.2 02/18/2020   HCT 39.2 02/18/2020   PLT 190.0 02/18/2020   GLUCOSE 93 02/18/2020   CHOL 134 02/18/2020   TRIG 50.0 02/18/2020   HDL 56.90 02/18/2020   LDLDIRECT 157.7 06/24/2012   LDLCALC 67 02/18/2020   ALT 16 02/18/2020   AST 20 02/18/2020   NA 140 02/18/2020   K 3.9 02/18/2020   CL 105 02/18/2020   CREATININE 0.79 02/18/2020   BUN 14 02/18/2020   CO2 30 02/18/2020   TSH 4.46 02/18/2020    MM 3D SCREEN BREAST BILATERAL  Result Date: 01/08/2020 CLINICAL DATA:  Screening. EXAM: DIGITAL SCREENING BILATERAL MAMMOGRAM WITH TOMO AND CAD COMPARISON:  Previous exam(s). ACR Breast Density Category b: There are scattered areas of fibroglandular density. FINDINGS: There are no findings suspicious for malignancy. Images were processed with CAD. IMPRESSION: No mammographic evidence of malignancy. A result letter of this screening mammogram will be mailed directly to the patient. RECOMMENDATION: Screening mammogram in one year. (Code:SM-B-01Y) BI-RADS CATEGORY  1: Negative. Electronically Signed   By: Lajean Manes M.D.   On: 01/08/2020 16:13       Assessment & Plan:   Problem List Items Addressed This Visit    Osteoporosis    Followed by endocrinology.  [facility]ia.        Knee pain    Intermittent flares as outlined.  Better currently.  Discussed ortho referral.  Will notify me if  desires referral.       Hypercholesteremia    On crestor.  Follow lipid panel and liver function tests.        Relevant Orders   CBC with Differential/Platelet (Completed)   Comprehensive metabolic panel (Completed)   Lipid panel (Completed)   TSH (Completed)   Health care maintenance    Physical today 02/18/20. Mammogram 01/08/20 - [facility].  Had colonoscopy.  States ok.  Obtain copy of results.         Other Visit Diagnoses    Need for immunization against influenza    -  Primary   Relevant Orders   Flu Vaccine QUAD High Dose(Fluad) (Completed)       Einar Pheasant, MD

## 2020-02-18 NOTE — Assessment & Plan Note (Addendum)
Physical today 02/18/20. Mammogram 01/08/20 - [facility].  Had colonoscopy.  States ok.  Obtain copy of results.

## 2020-02-21 ENCOUNTER — Encounter: Payer: Self-pay | Admitting: Internal Medicine

## 2020-02-21 DIAGNOSIS — M25569 Pain in unspecified knee: Secondary | ICD-10-CM | POA: Insufficient documentation

## 2020-02-21 NOTE — Assessment & Plan Note (Signed)
Followed by endocrinology.  Prolia.  

## 2020-02-21 NOTE — Assessment & Plan Note (Signed)
Intermittent flares as outlined.  Better currently.  Discussed ortho referral.  Will notify me if desires referral.

## 2020-02-21 NOTE — Assessment & Plan Note (Signed)
On crestor.  Follow lipid panel and liver function tests.   

## 2020-03-08 ENCOUNTER — Ambulatory Visit: Payer: Medicare Other | Attending: Internal Medicine

## 2020-03-08 ENCOUNTER — Ambulatory Visit: Payer: Self-pay

## 2020-03-08 DIAGNOSIS — Z23 Encounter for immunization: Secondary | ICD-10-CM

## 2020-03-08 NOTE — Progress Notes (Signed)
   Covid-19 Vaccination Clinic  Name:  Sonya Hernandez    MRN: [id number] DOB: 10-26-1945  03/08/2020  [name] was observed post Covid-19 immunization for 15 minutes without incident. She was provided with Vaccine Information Sheet and instruction to access the V-Safe system.   [name] was instructed to call 911 with any severe reactions post vaccine: Marland Kitchen Difficulty breathing  . Swelling of face and throat  . A fast heartbeat  . A bad rash all over body  . Dizziness and weakness

## 2020-03-11 DIAGNOSIS — D485 Neoplasm of uncertain behavior of skin: Secondary | ICD-10-CM | POA: Diagnosis not present

## 2020-03-11 DIAGNOSIS — Z7189 Other specified counseling: Secondary | ICD-10-CM | POA: Diagnosis not present

## 2020-03-11 DIAGNOSIS — L280 Lichen simplex chronicus: Secondary | ICD-10-CM | POA: Diagnosis not present

## 2020-03-11 DIAGNOSIS — Z85828 Personal history of other malignant neoplasm of skin: Secondary | ICD-10-CM | POA: Diagnosis not present

## 2020-03-11 DIAGNOSIS — L815 Leukoderma, not elsewhere classified: Secondary | ICD-10-CM | POA: Diagnosis not present

## 2020-03-11 DIAGNOSIS — L578 Other skin changes due to chronic exposure to nonionizing radiation: Secondary | ICD-10-CM | POA: Diagnosis not present

## 2020-03-24 DIAGNOSIS — M9905 Segmental and somatic dysfunction of pelvic region: Secondary | ICD-10-CM | POA: Diagnosis not present

## 2020-03-24 DIAGNOSIS — M955 Acquired deformity of pelvis: Secondary | ICD-10-CM | POA: Diagnosis not present

## 2020-03-24 DIAGNOSIS — M5136 Other intervertebral disc degeneration, lumbar region: Secondary | ICD-10-CM | POA: Diagnosis not present

## 2020-03-24 DIAGNOSIS — M9903 Segmental and somatic dysfunction of lumbar region: Secondary | ICD-10-CM | POA: Diagnosis not present

## 2020-04-28 DIAGNOSIS — M9903 Segmental and somatic dysfunction of lumbar region: Secondary | ICD-10-CM | POA: Diagnosis not present

## 2020-04-28 DIAGNOSIS — M5136 Other intervertebral disc degeneration, lumbar region: Secondary | ICD-10-CM | POA: Diagnosis not present

## 2020-04-28 DIAGNOSIS — M955 Acquired deformity of pelvis: Secondary | ICD-10-CM | POA: Diagnosis not present

## 2020-04-28 DIAGNOSIS — M9905 Segmental and somatic dysfunction of pelvic region: Secondary | ICD-10-CM | POA: Diagnosis not present

## 2020-05-06 ENCOUNTER — Other Ambulatory Visit: Payer: Self-pay | Admitting: Internal Medicine

## 2020-05-18 DIAGNOSIS — M810 Age-related osteoporosis without current pathological fracture: Secondary | ICD-10-CM | POA: Diagnosis not present

## 2020-05-26 DIAGNOSIS — M9903 Segmental and somatic dysfunction of lumbar region: Secondary | ICD-10-CM | POA: Diagnosis not present

## 2020-05-26 DIAGNOSIS — M9905 Segmental and somatic dysfunction of pelvic region: Secondary | ICD-10-CM | POA: Diagnosis not present

## 2020-05-26 DIAGNOSIS — M955 Acquired deformity of pelvis: Secondary | ICD-10-CM | POA: Diagnosis not present

## 2020-05-26 DIAGNOSIS — M5136 Other intervertebral disc degeneration, lumbar region: Secondary | ICD-10-CM | POA: Diagnosis not present

## 2020-06-23 DIAGNOSIS — H5203 Hypermetropia, bilateral: Secondary | ICD-10-CM | POA: Diagnosis not present

## 2020-06-23 DIAGNOSIS — H26499 Other secondary cataract, unspecified eye: Secondary | ICD-10-CM | POA: Diagnosis not present

## 2020-06-23 DIAGNOSIS — Z961 Presence of intraocular lens: Secondary | ICD-10-CM | POA: Diagnosis not present

## 2020-06-23 DIAGNOSIS — H33309 Unspecified retinal break, unspecified eye: Secondary | ICD-10-CM | POA: Diagnosis not present

## 2020-08-04 ENCOUNTER — Other Ambulatory Visit: Payer: Self-pay | Admitting: Internal Medicine

## 2020-08-13 DIAGNOSIS — L814 Other melanin hyperpigmentation: Secondary | ICD-10-CM | POA: Diagnosis not present

## 2020-08-13 DIAGNOSIS — C44719 Basal cell carcinoma of skin of left lower limb, including hip: Secondary | ICD-10-CM | POA: Diagnosis not present

## 2020-08-13 DIAGNOSIS — L821 Other seborrheic keratosis: Secondary | ICD-10-CM | POA: Diagnosis not present

## 2020-08-13 DIAGNOSIS — D2262 Melanocytic nevi of left upper limb, including shoulder: Secondary | ICD-10-CM | POA: Diagnosis not present

## 2020-08-13 DIAGNOSIS — D225 Melanocytic nevi of trunk: Secondary | ICD-10-CM | POA: Diagnosis not present

## 2020-08-13 DIAGNOSIS — X32XXXA Exposure to sunlight, initial encounter: Secondary | ICD-10-CM | POA: Diagnosis not present

## 2020-08-13 DIAGNOSIS — D2272 Melanocytic nevi of left lower limb, including hip: Secondary | ICD-10-CM | POA: Diagnosis not present

## 2020-08-13 DIAGNOSIS — D2271 Melanocytic nevi of right lower limb, including hip: Secondary | ICD-10-CM | POA: Diagnosis not present

## 2020-08-13 DIAGNOSIS — L570 Actinic keratosis: Secondary | ICD-10-CM | POA: Diagnosis not present

## 2020-08-13 DIAGNOSIS — D485 Neoplasm of uncertain behavior of skin: Secondary | ICD-10-CM | POA: Diagnosis not present

## 2020-08-13 DIAGNOSIS — D2261 Melanocytic nevi of right upper limb, including shoulder: Secondary | ICD-10-CM | POA: Diagnosis not present

## 2020-09-22 DIAGNOSIS — C44719 Basal cell carcinoma of skin of left lower limb, including hip: Secondary | ICD-10-CM | POA: Diagnosis not present

## 2020-10-26 DIAGNOSIS — M810 Age-related osteoporosis without current pathological fracture: Secondary | ICD-10-CM | POA: Diagnosis not present

## 2020-11-02 ENCOUNTER — Other Ambulatory Visit: Payer: Self-pay | Admitting: Internal Medicine

## 2020-11-02 DIAGNOSIS — M810 Age-related osteoporosis without current pathological fracture: Secondary | ICD-10-CM | POA: Diagnosis not present

## 2020-11-12 ENCOUNTER — Other Ambulatory Visit: Payer: Self-pay | Admitting: Internal Medicine

## 2020-11-15 ENCOUNTER — Ambulatory Visit (INDEPENDENT_AMBULATORY_CARE_PROVIDER_SITE_OTHER): Payer: Medicare HMO

## 2020-11-15 VITALS — Ht 65.0 in | Wt 127.0 lb

## 2020-11-15 DIAGNOSIS — Z0000 Encounter for general adult medical examination without abnormal findings: Secondary | ICD-10-CM | POA: Diagnosis not present

## 2020-11-15 NOTE — Progress Notes (Signed)
Subjective:   Sonya Hernandez is a 75 y.o. female who presents for Medicare Annual (Subsequent) preventive examination.  Review of Systems    No ROS.  Medicare Wellness   Virtual visit Cardiac Risk Factors include: advanced age (>20men, >25 women)     Objective:    Today's Vitals   11/15/20 0822  Weight: 127 lb (57.6 kg)  Height: 5\' 5"  (1.651 m)   Body mass index is 21.13 kg/m.  Advanced Directives 11/15/2020 11/13/2019 11/06/2018 10/31/2017 10/25/2016  Does Patient Have a Medical Advance Directive? Yes Yes Yes Yes Yes  Type of Paramedic of [place];Living will [place];Living will [place];Living will [place];Living will [place];Living will  Does patient want to make changes to medical advance directive? No - Patient declined No - Patient declined No - Patient declined No - Patient declined No - Patient declined  Copy of [place] in Chart? No - copy requested No - copy requested No - copy requested No - copy requested No - copy requested    Current Medications (verified) Outpatient Encounter Medications as of 11/15/2020  Medication Sig   Calcium Carbonate-Vitamin D 600-400 MG-UNIT per chew tablet Chew 1 tablet by mouth 2 (two) times daily.    Cholecalciferol 1000 UNITS capsule Take 1,000 Units by mouth daily.   clobetasol cream (TEMOVATE) 8.58 % Apply 1 application topically 2 (two) times a week.   denosumab (PROLIA) 60 MG/ML SOLN injection Inject 60 mg into the skin every 6 (six) months. Administer in upper arm, thigh, or abdomen   estradiol (ESTRACE) 0.1 MG/GM vaginal cream Place 2 g vaginally 3 (three) times a week.    ferrous sulfate 325 (65 FE) MG EC tablet Take 325 mg by mouth every other day.    rosuvastatin (CRESTOR) 10 MG tablet Take 1 tablet (10 mg total) by mouth daily.   No facility-administered encounter medications on file as of 11/15/2020.     Allergies (verified) Patient has no known allergies.   History: Past Medical History:  Diagnosis Date   Anemia    Cancer ([place])    skin   Osteoporosis    Past Surgical History:  Procedure Laterality Date   NO PAST SURGERIES     Family History  Problem Relation Age of Onset   Diabetes Mother    Thyroid disease Mother        Questionable   Colon cancer Mother        Questionable   Heart attack Father    Deep vein thrombosis Father    Diabetes Sister    Breast cancer Neg Hx    Social History   Socioeconomic History   Marital status: Married    Spouse name: Not on file   Number of children: 3   Years of education: Not on file   Highest education level: Not on file  Occupational History   Not on file  Tobacco Use   Smoking status: Never   Smokeless tobacco: Never  Vaping Use   Vaping Use: Never used  Substance and Sexual Activity   Alcohol use: No    Alcohol/week: 0.0 standard drinks   Drug use: No   Sexual activity: Yes  Other Topics Concern   Not on file  Social History Narrative   She is married and has 3 daughters.   Social Determinants of Health   Financial Resource Strain: Low Risk    Difficulty of  Paying Living Expenses: Not hard at all  Food Insecurity: No Food Insecurity   Worried About [place] in the Last Year: Never true   Ran Out of Food in the Last Year: Never true  Transportation Needs: No Transportation Needs   Lack of Transportation (Medical): No   Lack of Transportation (Non-Medical): No  Physical Activity: Sufficiently Active   Days of Exercise per Week: 5 days   Minutes of Exercise per Session: 30 min  Stress: No Stress Concern Present   Feeling of Stress : Not at all  Social Connections: Unknown   Frequency of Communication with Friends and Family: More than three times a week   Frequency of Social Gatherings with Friends and Family: More than three times a week   Attends Religious Services: Not on Music therapist or Organizations: Not on file   Attends Archivist Meetings: Not on file   Marital Status: Married    Tobacco Counseling Counseling given: Not Answered   Clinical Intake:  Pre-visit preparation completed: Yes        Diabetes: No  How often do you need to have someone help you when you read instructions, pamphlets, or other written materials from your doctor or pharmacy?: 1 - Never  Interpreter Needed?: No      Activities of Daily Living In your present state of health, do you have any difficulty performing the following activities: 11/15/2020  Hearing? N  Vision? N  Difficulty concentrating or making decisions? N  Walking or climbing stairs? N  Dressing or bathing? N  Doing errands, shopping? N  Preparing Food and eating ? N  Using the Toilet? N  In the past six months, have you accidently leaked urine? N  Do you have problems with loss of bowel control? N  Managing your Medications? N  Managing your Finances? N  Housekeeping or managing your Housekeeping? N  Some recent data might be hidden    Patient Care Team: Einar Pheasant, MD as PCP - General (Internal Medicine)  Indicate any recent Medical Services you may have received from other than [facility] providers in the past year (date may be approximate).     Assessment:   This is a routine wellness examination for [name].  I connected with Daris today by telephone and verified that I am speaking with the correct person using two identifiers. Location patient: home Location provider: work Persons participating in the virtual visit: patient, Marine scientist.    I discussed the limitations, risks, security and privacy concerns of performing an evaluation and management service by telephone and the availability of in person appointments. The patient expressed understanding and verbally consented to this telephonic visit.    Interactive audio and video telecommunications were attempted between  this provider and patient, however failed, due to patient having technical difficulties OR patient did not have access to video capability.  We continued and completed visit with audio only.  Some vital signs may be absent or patient reported.   Hearing/Vision screen Hearing Screening - Comments:: Patient has some difficulty hearing conversational tones in a crowd and when watching television. Audiology testing deferred per patient preference.  Vision Screening - Comments:: Wears reader lenses  Cataract extraction, bilateral  Visual acuity not assessed, virtual visit. They have seen their ophthalmologist in the last 12 months.   Dietary issues and exercise activities discussed: Current Exercise Habits: Home exercise routine, Type of exercise: calisthenics;walking, Frequency (Times/Week): 5, Intensity: Moderate Healthy diet  Good water intake   Goals Addressed               This Visit's Progress     Patient Stated     I would like to stretch more before/after exercise (pt-stated)         Depression Screen PHQ 2/9 Scores 11/15/2020 02/18/2020 11/13/2019 02/17/2019 11/06/2018 10/31/2017 07/02/2017  PHQ - 2 Score 0 0 0 0 0 0 0  PHQ- 9 Score - - - - - - -    Fall Risk Fall Risk  11/15/2020 02/18/2020 11/13/2019 02/17/2019 11/06/2018  Falls in the past year? 0 0 0 0 0  Number falls in past yr: 0 0 0 - -  Injury with Fall? 0 0 - - -  Follow up Falls evaluation completed Falls evaluation completed Falls evaluation completed Falls evaluation completed -    FALL RISK PREVENTION PERTAINING TO THE HOME: Handrails in use when climbing stairs? Yes Home free of loose throw rugs in walkways, pet beds, electrical cords, etc? Yes  Adequate lighting in your home to reduce risk of falls? Yes   ASSISTIVE DEVICES UTILIZED TO PREVENT FALLS:  Life alert? No  Use of a cane, walker or w/c? No    TIMED UP AND GO: Was the test performed? No .   Cognitive Function: Patient is alert and oriented x3.   Denies difficulty focusing, making decisions, memory loss.  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  MMSE/6CIT deferred. Normal by direct communication/observation.   MMSE - Mini Mental State Exam 11/13/2019 10/31/2017 10/25/2016  Not completed: Unable to complete - -  Orientation to time - 5 5  Orientation to Place - 5 5  Registration - 3 3  Attention/ Calculation - 5 5  Recall - 3 3  Language- name 2 objects - 2 2  Language- repeat - 1 1  Language- follow 3 step command - 3 3  Language- read & follow direction - 1 1  Write a sentence - 1 1  Copy design - 1 1  Total score - 30 30     6CIT Screen 11/06/2018  What Year? 0 points  What month? 0 points  What time? 0 points  Count back from 20 0 points  Months in reverse 0 points  Repeat phrase 0 points  Total Score 0    Immunizations Immunization History  Administered Date(s) Administered   Fluad Quad(high Dose 65+) 02/18/2020   Influenza, High Dose Seasonal PF 06/07/2018   Influenza,inj,Quad PF,6+ Mos 06/25/2013, 06/25/2014, 06/28/2015   Influenza-Unspecified 06/14/2016, 02/26/2019   PFIZER(Purple Top)SARS-COV-2 Vaccination 06/12/2019, 07/03/2019, 03/08/2020   Pneumococcal Conjugate-13 06/25/2013   Pneumococcal Polysaccharide-23 06/28/2016   Zoster Recombinat (Shingrix) 05/16/2017, 08/28/2017    TDAP status: Due, Education has been provided regarding the importance of this vaccine. Advised may receive this vaccine at local pharmacy or Health Dept. Aware to provide a copy of the vaccination record if obtained from local pharmacy or Health Dept. Verbalized acceptance and understanding. Deferred.   Health Maintenance Health Maintenance  Topic Date Due   COVID-19 Vaccine (4 - Booster for Pfizer series) 07/09/2020   TETANUS/TDAP  11/15/2021 (Originally 12/07/1964)   INFLUENZA VACCINE  01/03/2021   DEXA SCAN  Completed   Hepatitis C Screening  Completed   PNA vac Low Risk Adult  Completed    Zoster Vaccines- Shingrix  Completed   HPV VACCINES  Aged Out    Colorectal cancer screening: No longer required.   Mammogram  status: Completed 01/08/20. Repeat every year  Lung Cancer Screening: (Low Dose CT Chest recommended if Age 21-80 years, 30 pack-year currently smoking OR have quit w/in 15years.) does not qualify.   Vision Screening: Recommended annual ophthalmology exams for early detection of glaucoma and other disorders of the eye. Is the patient up to date with their annual eye exam?  Yes   Dental Screening: Recommended annual dental exams for proper oral hygiene  Community Resource Referral / Chronic Care Management: CRR required this visit?  No   CCM required this visit?  No      Plan:   Keep all routine maintenance appointments.   I have personally reviewed and noted the following in the patient's chart:   Medical and social history Use of alcohol, tobacco or illicit drugs  Current medications and supplements including opioid prescriptions. This patient is not currently taking opioid. Functional ability and status Nutritional status Physical activity Advanced directives List of other physicians Hospitalizations, surgeries, and ER visits in previous 12 months Vitals Screenings to include cognitive, depression, and falls Referrals and appointments  In addition, I have reviewed and discussed with patient certain preventive protocols, quality metrics, and best practice recommendations. A written personalized care plan for preventive services as well as general preventive health recommendations were provided to patient via mychart.     Varney Biles, LPN   09/21/3788

## 2020-11-15 NOTE — Patient Instructions (Addendum)
Sonya Hernandez , Thank you for taking time to come for your Medicare Wellness Visit. I appreciate your ongoing commitment to your health goals. Please review the following plan we discussed and let me know if I can assist you in the future.   These are the goals we discussed:  Goals       Patient Stated     I would like to stretch more before/after exercise (pt-stated)      Other     Follow up with Primary Care Provider      As needed         This is a list of the screening recommended for you and due dates:  Health Maintenance  Topic Date Due   COVID-19 Vaccine (4 - Booster for Pfizer series) 07/09/2020   Tetanus Vaccine  11/15/2021*   Flu Shot  01/03/2021   DEXA scan (bone density measurement)  Completed   Hepatitis C Screening: USPSTF Recommendation to screen - Ages 9-79 yo.  Completed   Pneumonia vaccines  Completed   Zoster (Shingles) Vaccine  Completed   HPV Vaccine  Aged Out  *Topic was postponed. The date shown is not the original due date.    Advanced directives: End of life planning; Advance aging; Advanced directives discussed.  Copy of current HCPOA/Living Will requested.    Conditions/risks identified: none new  Follow up in one year for your annual wellness visit   Preventive Care 65 Years and Older, Female Preventive care refers to lifestyle choices and visits with your health care provider that can promote health and wellness. What does preventive care include? A yearly physical exam. This is also called an annual well check. Dental exams once or twice a year. Routine eye exams. Ask your health care provider how often you should have your eyes checked. Personal lifestyle choices, including: Daily care of your teeth and gums. Regular physical activity. Eating a healthy diet. Avoiding tobacco and drug use. Limiting alcohol use. Practicing safe sex. Taking low-dose aspirin every day. Taking vitamin and mineral supplements as recommended by your health  care provider. What happens during an annual well check? The services and screenings done by your health care provider during your annual well check will depend on your age, overall health, lifestyle risk factors, and family history of disease. Counseling  Your health care provider may ask you questions about your: Alcohol use. Tobacco use. Drug use. Emotional well-being. Home and relationship well-being. Sexual activity. Eating habits. History of falls. Memory and ability to understand (cognition). Work and work Statistician. Reproductive health. Screening  You may have the following tests or measurements: Height, weight, and BMI. Blood pressure. Lipid and cholesterol levels. These may be checked every 5 years, or more frequently if you are over 49 years old. Skin check. Lung cancer screening. You may have this screening every year starting at age 42 if you have a 30-pack-year history of smoking and currently smoke or have quit within the past 15 years. Fecal occult blood test (FOBT) of the stool. You may have this test every year starting at age 37. Flexible sigmoidoscopy or colonoscopy. You may have a sigmoidoscopy every 5 years or a colonoscopy every 10 years starting at age 58. Hepatitis C blood test. Hepatitis B blood test. Sexually transmitted disease (STD) testing. Diabetes screening. This is done by checking your blood sugar (glucose) after you have not eaten for a while (fasting). You may have this done every 1-3 years. Bone density scan. This is done to  screen for osteoporosis. You may have this done starting at age 42. Mammogram. This may be done every 1-2 years. Talk to your health care provider about how often you should have regular mammograms. Talk with your health care provider about your test results, treatment options, and if necessary, the need for more tests. Vaccines  Your health care provider may recommend certain vaccines, such as: Influenza vaccine. This is  recommended every year. Tetanus, diphtheria, and acellular pertussis (Tdap, Td) vaccine. You may need a Td booster every 10 years. Zoster vaccine. You may need this after age 82. Pneumococcal 13-valent conjugate (PCV13) vaccine. One dose is recommended after age 20. Pneumococcal polysaccharide (PPSV23) vaccine. One dose is recommended after age 13. Talk to your health care provider about which screenings and vaccines you need and how often you need them. This information is not intended to replace advice given to you by your health care provider. Make sure you discuss any questions you have with your health care provider. Document Released: 06/18/2015 Document Revised: 02/09/2016 Document Reviewed: 03/23/2015 Elsevier Interactive Patient Education  2017 [place] Prevention in the Home Falls can cause injuries. They can happen to people of all ages. There are many things you can do to make your home safe and to help prevent falls. What can I do on the outside of my home? Regularly fix the edges of walkways and driveways and fix any cracks. Remove anything that might make you trip as you walk through a door, such as a raised step or threshold. Trim any bushes or trees on the path to your home. Use bright outdoor lighting. Clear any walking paths of anything that might make someone trip, such as rocks or tools. Regularly check to see if handrails are loose or broken. Make sure that both sides of any steps have handrails. Any raised decks and porches should have guardrails on the edges. Have any leaves, snow, or ice cleared regularly. Use sand or salt on walking paths during winter. Clean up any spills in your garage right away. This includes oil or grease spills. What can I do in the bathroom? Use night lights. Install grab bars by the toilet and in the tub and shower. Do not use towel bars as grab bars. Use non-skid mats or decals in the tub or shower. If you need to sit down in  the shower, use a plastic, non-slip stool. Keep the floor dry. Clean up any water that spills on the floor as soon as it happens. Remove soap buildup in the tub or shower regularly. Attach bath mats securely with double-sided non-slip rug tape. Do not have throw rugs and other things on the floor that can make you trip. What can I do in the bedroom? Use night lights. Make sure that you have a light by your bed that is easy to reach. Do not use any sheets or blankets that are too big for your bed. They should not hang down onto the floor. Have a firm chair that has side arms. You can use this for support while you get dressed. Do not have throw rugs and other things on the floor that can make you trip. What can I do in the kitchen? Clean up any spills right away. Avoid walking on wet floors. Keep items that you use a lot in easy-to-reach places. If you need to reach something above you, use a strong step stool that has a grab bar. Keep electrical cords out of the way.  Do not use floor polish or wax that makes floors slippery. If you must use wax, use non-skid floor wax. Do not have throw rugs and other things on the floor that can make you trip. What can I do with my stairs? Do not leave any items on the stairs. Make sure that there are handrails on both sides of the stairs and use them. Fix handrails that are broken or loose. Make sure that handrails are as long as the stairways. Check any carpeting to make sure that it is firmly attached to the stairs. Fix any carpet that is loose or worn. Avoid having throw rugs at the top or bottom of the stairs. If you do have throw rugs, attach them to the floor with carpet tape. Make sure that you have a light switch at the top of the stairs and the bottom of the stairs. If you do not have them, ask someone to add them for you. What else can I do to help prevent falls? Wear shoes that: Do not have high heels. Have rubber bottoms. Are comfortable  and fit you well. Are closed at the toe. Do not wear sandals. If you use a stepladder: Make sure that it is fully opened. Do not climb a closed stepladder. Make sure that both sides of the stepladder are locked into place. Ask someone to hold it for you, if possible. Clearly mark and make sure that you can see: Any grab bars or handrails. First and last steps. Where the edge of each step is. Use tools that help you move around (mobility aids) if they are needed. These include: Canes. Walkers. Scooters. Crutches. Turn on the lights when you go into a dark area. Replace any light bulbs as soon as they burn out. Set up your furniture so you have a clear path. Avoid moving your furniture around. If any of your floors are uneven, fix them. If there are any pets around you, be aware of where they are. Review your medicines with your doctor. Some medicines can make you feel dizzy. This can increase your chance of falling. Ask your doctor what other things that you can do to help prevent falls. This information is not intended to replace advice given to you by your health care provider. Make sure you discuss any questions you have with your health care provider. Document Released: 03/18/2009 Document Revised: 10/28/2015 Document Reviewed: 06/26/2014 Elsevier Interactive Patient Education  2017 [employer].

## 2020-11-17 DIAGNOSIS — M810 Age-related osteoporosis without current pathological fracture: Secondary | ICD-10-CM | POA: Diagnosis not present

## 2021-01-04 ENCOUNTER — Other Ambulatory Visit: Payer: Self-pay | Admitting: Internal Medicine

## 2021-01-04 DIAGNOSIS — Z1231 Encounter for screening mammogram for malignant neoplasm of breast: Secondary | ICD-10-CM

## 2021-02-01 ENCOUNTER — Other Ambulatory Visit: Payer: Self-pay

## 2021-02-01 ENCOUNTER — Ambulatory Visit
Admission: RE | Admit: 2021-02-01 | Discharge: 2021-02-01 | Disposition: A | Discharge status: Home / Self Care | Payer: Medicare HMO | Source: Ambulatory Visit | Attending: Internal Medicine | Admitting: Internal Medicine

## 2021-02-01 DIAGNOSIS — Z1231 Encounter for screening mammogram for malignant neoplasm of breast: Secondary | ICD-10-CM | POA: Diagnosis not present

## 2021-02-16 ENCOUNTER — Other Ambulatory Visit: Payer: Self-pay | Admitting: Internal Medicine

## 2021-02-21 ENCOUNTER — Encounter: Payer: Medicare Other | Admitting: Internal Medicine

## 2021-03-31 DIAGNOSIS — I821 Thrombophlebitis migrans: Secondary | ICD-10-CM | POA: Diagnosis not present

## 2021-03-31 DIAGNOSIS — L814 Other melanin hyperpigmentation: Secondary | ICD-10-CM | POA: Diagnosis not present

## 2021-03-31 DIAGNOSIS — X32XXXA Exposure to sunlight, initial encounter: Secondary | ICD-10-CM | POA: Diagnosis not present

## 2021-03-31 DIAGNOSIS — D235 Other benign neoplasm of skin of trunk: Secondary | ICD-10-CM | POA: Diagnosis not present

## 2021-03-31 DIAGNOSIS — Z85828 Personal history of other malignant neoplasm of skin: Secondary | ICD-10-CM | POA: Diagnosis not present

## 2021-03-31 DIAGNOSIS — Z08 Encounter for follow-up examination after completed treatment for malignant neoplasm: Secondary | ICD-10-CM | POA: Diagnosis not present

## 2021-04-01 ENCOUNTER — Ambulatory Visit (INDEPENDENT_AMBULATORY_CARE_PROVIDER_SITE_OTHER): Payer: Medicare HMO | Admitting: Internal Medicine

## 2021-04-01 ENCOUNTER — Encounter: Payer: Self-pay | Admitting: Internal Medicine

## 2021-04-01 ENCOUNTER — Other Ambulatory Visit: Payer: Self-pay

## 2021-04-01 VITALS — BP 112/70 | HR 62 | Temp 97.6°F | Resp 16 | Ht 65.0 in | Wt 124.2 lb

## 2021-04-01 DIAGNOSIS — D649 Anemia, unspecified: Secondary | ICD-10-CM

## 2021-04-01 DIAGNOSIS — R200 Anesthesia of skin: Secondary | ICD-10-CM | POA: Diagnosis not present

## 2021-04-01 DIAGNOSIS — Z0000 Encounter for general adult medical examination without abnormal findings: Secondary | ICD-10-CM | POA: Diagnosis not present

## 2021-04-01 DIAGNOSIS — M810 Age-related osteoporosis without current pathological fracture: Secondary | ICD-10-CM

## 2021-04-01 DIAGNOSIS — M25561 Pain in right knee: Secondary | ICD-10-CM | POA: Diagnosis not present

## 2021-04-01 DIAGNOSIS — E7800 Pure hypercholesterolemia, unspecified: Secondary | ICD-10-CM | POA: Diagnosis not present

## 2021-04-01 LAB — CBC WITH DIFFERENTIAL/PLATELET
Basophils Absolute: 0 10*3/uL (ref 0.0–0.1)
Basophils Relative: 0.4 % (ref 0.0–3.0)
Eosinophils Absolute: 0.3 10*3/uL (ref 0.0–0.7)
Eosinophils Relative: 6.4 % — ABNORMAL HIGH (ref 0.0–5.0)
HCT: 40.5 % (ref 36.0–46.0)
Hemoglobin: 13.2 g/dL (ref 12.0–15.0)
Lymphocytes Relative: 43.2 % (ref 12.0–46.0)
Lymphs Abs: 2.2 10*3/uL (ref 0.7–4.0)
MCHC: 32.6 g/dL (ref 30.0–36.0)
MCV: 96.7 fl (ref 78.0–100.0)
Monocytes Absolute: 0.4 10*3/uL (ref 0.1–1.0)
Monocytes Relative: 8.1 % (ref 3.0–12.0)
Neutro Abs: 2.1 10*3/uL (ref 1.4–7.7)
Neutrophils Relative %: 41.9 % — ABNORMAL LOW (ref 43.0–77.0)
Platelets: 194 10*3/uL (ref 150.0–400.0)
RBC: 4.18 Mil/uL (ref 3.87–5.11)
RDW: 13.4 % (ref 11.5–15.5)
WBC: 5 10*3/uL (ref 4.0–10.5)

## 2021-04-01 LAB — LIPID PANEL
Cholesterol: 156 mg/dL (ref 0–200)
HDL: 52.8 mg/dL (ref >39.00)
LDL Cholesterol: 88 mg/dL (ref 0–99)
NonHDL: 103.19
Total CHOL/HDL Ratio: 3
Triglycerides: 78 mg/dL (ref 0.0–149.0)
VLDL: 15.6 mg/dL (ref 0.0–40.0)

## 2021-04-01 LAB — COMPREHENSIVE METABOLIC PANEL
ALT: 18 U/L (ref 0–35)
AST: 24 U/L (ref 0–37)
Albumin: 4.1 g/dL (ref 3.5–5.2)
Alkaline Phosphatase: 46 U/L (ref 39–117)
BUN: 12 mg/dL (ref 6–23)
CO2: 30 mEq/L (ref 19–32)
Calcium: 9.7 mg/dL (ref 8.4–10.5)
Chloride: 105 mEq/L (ref 96–112)
Creatinine, Ser: 0.78 mg/dL (ref 0.40–1.20)
GFR: 73.32 mL/min (ref >60.00)
Glucose, Bld: 88 mg/dL (ref 70–99)
Potassium: 4.1 mEq/L (ref 3.5–5.1)
Sodium: 141 mEq/L (ref 135–145)
Total Bilirubin: 0.7 mg/dL (ref 0.2–1.2)
Total Protein: 6.6 g/dL (ref 6.0–8.3)

## 2021-04-01 LAB — TSH: TSH: 3.71 u[IU]/mL (ref 0.35–5.50)

## 2021-04-01 NOTE — Assessment & Plan Note (Addendum)
Physical today 04/01/21.  Mammogram 02/01/21 - Birads I.  Colonoscopy 2021.  Continue prolia.

## 2021-04-01 NOTE — Progress Notes (Signed)
Patient ID: Delona B Middlebrooks, female   DOB: 22-Jan-1946, 75 y.o.   MRN: [id number]   Subjective:    Patient ID: Noha B Forgione, female    DOB: Sep 28, 1945, 75 y.o.   MRN: [id number]  This visit occurred during the SARS-CoV-2 public health emergency.  Safety protocols were in place, including screening questions prior to the visit, additional usage of staff PPE, and extensive cleaning of exam room while observing appropriate contact time as indicated for disinfecting solutions.   Patient here for her physical exam.   Chief Complaint  Patient presents with   Annual Exam   .   HPI She is doing well.  Feels good.  Stays active.  No chest pain or sob with increased activity or exertion.  Blood pressures averaging 110-120s/60s.  No acid reflux.  No abdominal pain.  Bowels stable.  She has noticed some numbness - hand and first three fingers - left hand (occasionally numbness in right hand).  Notices more at night/am - wakes up with numbness.  No loss of grip strength.  Some isues with her right knee.  Is doing better.  Exercises help.    Past Medical History:  Diagnosis Date   Anemia    Cancer ([place])    skin   Osteoporosis    Past Surgical History:  Procedure Laterality Date   NO PAST SURGERIES     Family History  Problem Relation Age of Onset   Diabetes Mother    Thyroid disease Mother        Questionable   Colon cancer Mother        Questionable   Heart attack Father    Deep vein thrombosis Father    Diabetes Sister    Breast cancer Neg Hx    Social History   Socioeconomic History   Marital status: Married    Spouse name: Not on file   Number of children: 3   Years of education: Not on file   Highest education level: Not on file  Occupational History   Not on file  Tobacco Use   Smoking status: Never   Smokeless tobacco: Never  Vaping Use   Vaping Use: Never used  Substance and Sexual Activity   Alcohol use: No    Alcohol/week: 0.0 standard drinks   Drug use: No    Sexual activity: Yes  Other Topics Concern   Not on file  Social History Narrative   She is married and has 3 daughters.   Social Determinants of Health   Financial Resource Strain: Low Risk    Difficulty of Paying Living Expenses: Not hard at all  Food Insecurity: No Food Insecurity   Worried About Charity fundraiser in the Last Year: Never true   [place] in the Last Year: Never true  Transportation Needs: No Transportation Needs   Lack of Transportation (Medical): No   Lack of Transportation (Non-Medical): No  Physical Activity: Sufficiently Active   Days of Exercise per Week: 5 days   Minutes of Exercise per Session: 30 min  Stress: No Stress Concern Present   Feeling of Stress : Not at all  Social Connections: Unknown   Frequency of Communication with Friends and Family: More than three times a week   Frequency of Social Gatherings with Friends and Family: More than three times a week   Attends Religious Services: Not on file   Active Member of Clubs or Organizations: Not on file   Attends Club or  Organization Meetings: Not on file   Marital Status: Married     Review of Systems  Constitutional:  Negative for appetite change and unexpected weight change.  HENT:  Negative for congestion, sinus pressure and sore throat.   Eyes:  Negative for pain and visual disturbance.  Respiratory:  Negative for cough, chest tightness and shortness of breath.   Cardiovascular:  Negative for chest pain, palpitations and leg swelling.  Gastrointestinal:  Negative for abdominal pain, diarrhea, nausea and vomiting.  Genitourinary:  Negative for difficulty urinating and dysuria.  Musculoskeletal:  Negative for joint swelling and myalgias.       Right knee issues as outlined.    Skin:  Negative for color change and rash.  Neurological:  Negative for dizziness, light-headedness and headaches.  Hematological:  Negative for adenopathy. Does not bruise/bleed easily.   Psychiatric/Behavioral:  Negative for agitation and dysphoric mood.       Objective:     BP 112/70   Pulse 62   Temp 97.6 F (36.4 C)   Resp 16   Ht 5\' 5"  (1.651 m)   Wt 124 lb 3.2 oz (56.3 kg)   LMP 06/24/1993   SpO2 98%   BMI 20.67 kg/m  Wt Readings from Last 3 Encounters:  04/01/21 124 lb 3.2 oz (56.3 kg)  11/15/20 127 lb (57.6 kg)  02/18/20 127 lb 12.8 oz (58 kg)    Physical Exam Vitals reviewed.  Constitutional:      General: She is not in acute distress.    Appearance: Normal appearance. She is well-developed.  HENT:     Head: Normocephalic and atraumatic.     Right Ear: External ear normal.     Left Ear: External ear normal.  Eyes:     General: No scleral icterus.       Right eye: No discharge.        Left eye: No discharge.     Conjunctiva/sclera: Conjunctivae normal.  Neck:     Thyroid: No thyromegaly.  Cardiovascular:     Rate and Rhythm: Normal rate and regular rhythm.  Pulmonary:     Effort: No tachypnea, accessory muscle usage or respiratory distress.     Breath sounds: Normal breath sounds. No decreased breath sounds or wheezing.  Chest:  Breasts:    Right: No inverted nipple, mass, nipple discharge or tenderness (no axillary adenopathy).     Left: No inverted nipple, mass, nipple discharge or tenderness (no axilarry adenopathy).  Abdominal:     General: Bowel sounds are normal.     Palpations: Abdomen is soft.     Tenderness: There is no abdominal tenderness.  Musculoskeletal:        General: No swelling or tenderness.     Cervical back: Neck supple.     Comments: Grip strength normal.  Negative phalen's and negative tinel's.   Lymphadenopathy:     Cervical: No cervical adenopathy.  Skin:    Findings: No erythema or rash.  Neurological:     Mental Status: She is alert and oriented to person, place, and time.  Psychiatric:        Mood and Affect: Mood normal.        Behavior: Behavior normal.     Outpatient Encounter Medications as  of 04/01/2021  Medication Sig   Calcium Carbonate-Vitamin D 600-400 MG-UNIT per chew tablet Chew 1 tablet by mouth 2 (two) times daily.    Cholecalciferol 1000 UNITS capsule Take 1,000 Units by mouth daily.   clobetasol  cream (TEMOVATE) 9.37 % Apply 1 application topically 2 (two) times a week.   denosumab (PROLIA) 60 MG/ML SOLN injection Inject 60 mg into the skin every 6 (six) months. Administer in upper arm, thigh, or abdomen   estradiol (ESTRACE) 0.1 MG/GM vaginal cream Place 2 g vaginally 3 (three) times a week.    ferrous sulfate 325 (65 FE) MG EC tablet Take 325 mg by mouth every other day.    rosuvastatin (CRESTOR) 10 MG tablet Take 1 tablet (10 mg total) by mouth daily.   No facility-administered encounter medications on file as of 04/01/2021.     Lab Results  Component Value Date   WBC 5.0 04/01/2021   HGB 13.2 04/01/2021   HCT 40.5 04/01/2021   PLT 194.0 04/01/2021   GLUCOSE 88 04/01/2021   CHOL 156 04/01/2021   TRIG 78.0 04/01/2021   HDL 52.80 04/01/2021   LDLDIRECT 157.7 06/24/2012   LDLCALC 88 04/01/2021   ALT 18 04/01/2021   AST 24 04/01/2021   NA 141 04/01/2021   K 4.1 04/01/2021   CL 105 04/01/2021   CREATININE 0.78 04/01/2021   BUN 12 04/01/2021   CO2 30 04/01/2021   TSH 3.71 04/01/2021    MM 3D SCREEN BREAST BILATERAL  Result Date: 02/05/2021 CLINICAL DATA:  Screening. EXAM: DIGITAL SCREENING BILATERAL MAMMOGRAM WITH TOMOSYNTHESIS AND CAD TECHNIQUE: Bilateral screening digital craniocaudal and mediolateral oblique mammograms were obtained. Bilateral screening digital breast tomosynthesis was performed. The images were evaluated with computer-aided detection. COMPARISON:  Previous exam(s). ACR Breast Density Category b: There are scattered areas of fibroglandular density. FINDINGS: There are no findings suspicious for malignancy. IMPRESSION: No mammographic evidence of malignancy. A result letter of this screening mammogram will be mailed directly to the  patient. RECOMMENDATION: Screening mammogram in one year. (Code:SM-B-01Y) BI-RADS CATEGORY  1: Negative. Electronically Signed   By: Everlean Alstrom M.D.   On: 02/05/2021 09:15      Assessment & Plan:   Problem List Items Addressed This Visit     Anemia    Follow cbc.       Hand numbness    Worse in am/or through night.  Uses hands a lot.  Crochets.  Appears to be c/w CTS.  Discussed conservative measures - cock up wrist splint.  Call with update.       Health care maintenance    Physical today 04/01/21.  Mammogram 02/01/21 - Birads I.  Colonoscopy 2021.  Continue prolia.        Hypercholesteremia    On crestor.  Follow lipid panel and liver function tests.        Relevant Orders   CBC with Differential/Platelet (Completed)   Comprehensive metabolic panel (Completed)   Lipid panel (Completed)   TSH (Completed)   Knee pain    Intermittent flares.  Exercises help.  Not a significant problem for her now.  Desires no further intervention.  Follow.       Osteoporosis    Continue prolia.        Other Visit Diagnoses     Routine general medical examination at a health care facility    -  Primary        Einar Pheasant, MD

## 2021-04-02 ENCOUNTER — Encounter: Payer: Self-pay | Admitting: Internal Medicine

## 2021-04-02 DIAGNOSIS — R200 Anesthesia of skin: Secondary | ICD-10-CM | POA: Insufficient documentation

## 2021-04-02 NOTE — Assessment & Plan Note (Signed)
On crestor.  Follow lipid panel and liver function tests.   

## 2021-04-02 NOTE — Assessment & Plan Note (Signed)
Worse in am/or through night.  Uses hands a lot.  Crochets.  Appears to be c/w CTS.  Discussed conservative measures - cock up wrist splint.  Call with update.

## 2021-04-02 NOTE — Assessment & Plan Note (Signed)
Intermittent flares.  Exercises help.  Not a significant problem for her now.  Desires no further intervention.  Follow.

## 2021-04-02 NOTE — Assessment & Plan Note (Signed)
Follow cbc.  

## 2021-04-02 NOTE — Assessment & Plan Note (Signed)
Continue prolia.  

## 2021-06-02 DIAGNOSIS — M810 Age-related osteoporosis without current pathological fracture: Secondary | ICD-10-CM | POA: Diagnosis not present

## 2021-06-23 DIAGNOSIS — H26499 Other secondary cataract, unspecified eye: Secondary | ICD-10-CM | POA: Diagnosis not present

## 2021-06-23 DIAGNOSIS — Z961 Presence of intraocular lens: Secondary | ICD-10-CM | POA: Diagnosis not present

## 2021-06-23 DIAGNOSIS — H33309 Unspecified retinal break, unspecified eye: Secondary | ICD-10-CM | POA: Diagnosis not present

## 2021-06-23 DIAGNOSIS — H5203 Hypermetropia, bilateral: Secondary | ICD-10-CM | POA: Diagnosis not present

## 2021-10-11 DIAGNOSIS — M810 Age-related osteoporosis without current pathological fracture: Secondary | ICD-10-CM | POA: Diagnosis not present

## 2021-10-18 DIAGNOSIS — M810 Age-related osteoporosis without current pathological fracture: Secondary | ICD-10-CM | POA: Diagnosis not present

## 2021-11-16 ENCOUNTER — Ambulatory Visit (INDEPENDENT_AMBULATORY_CARE_PROVIDER_SITE_OTHER): Payer: Medicare HMO

## 2021-11-16 VITALS — Ht 65.0 in | Wt 124.0 lb

## 2021-11-16 DIAGNOSIS — Z0000 Encounter for general adult medical examination without abnormal findings: Secondary | ICD-10-CM

## 2021-11-16 NOTE — Progress Notes (Signed)
Subjective:   Sonya Hernandez is a 76 y.o. female who presents for Medicare Annual (Subsequent) preventive examination.  Review of Systems    No ROS.  Medicare Wellness Virtual Visit.  Visual/audio telehealth visit, UTA vital signs.   See social history for additional risk factors.   Cardiac Risk Factors include: advanced age (>41mn, >>39women)     Objective:    Today's Vitals   11/16/21 0901  Weight: 124 lb (56.2 kg)  Height: '5\' 5"'$  (1.651 m)   Body mass index is 20.63 kg/m.     11/16/2021    9:01 AM 11/15/2020    8:27 AM 11/13/2019    8:50 AM 11/06/2018    9:09 AM 10/31/2017   10:28 AM 10/25/2016   10:13 AM  Advanced Directives  Does Patient Have a Medical Advance Directive? Yes Yes Yes Yes Yes Yes  Type of AParamedicof AOjo SarcoLiving will HNordLiving will HPenningtonLiving will HPhil[place]Living will HGirardLiving will [place]Matanuska[place]Susit[place]Living will  Does patient want to make changes to medical advance directive? No - Patient declined No - Patient declined No - Patient declined No - Patient declined No - Patient declined No - Patient declined  Copy of [place] Chart? No - copy requested No - copy requested No - copy requested No - copy requested No - copy requested No - copy requested    Current Medications (verified) Outpatient Encounter Medications as of 11/16/2021  Medication Sig   Calcium Carbonate-Vitamin D 600-400 MG-UNIT per chew tablet Chew 1 tablet by mouth 2 (two) times daily.    Cholecalciferol 1000 UNITS capsule Take 1,000 Units by mouth daily.   clobetasol cream (TEMOVATE) 03.81% Apply 1 application topically 2 (two) times a week.   denosumab (PROLIA) 60 MG/ML SOLN injection Inject 60 mg into the skin every 6 (six) months. Administer in upper arm, thigh, or abdomen   estradiol (ESTRACE) 0.1 MG/GM vaginal cream Place 2 g  vaginally 3 (three) times a week.    ferrous sulfate 325 (65 FE) MG EC tablet Take 325 mg by mouth every other day.    rosuvastatin (CRESTOR) 10 MG tablet Take 1 tablet (10 mg total) by mouth daily.   No facility-administered encounter medications on file as of 11/16/2021.    Allergies (verified) Patient has no known allergies.   History: Past Medical History:  Diagnosis Date   Anemia    [place]    skin   Osteoporosis    Past Surgical History:  Procedure Laterality Date   NO PAST SURGERIES     Family History  Problem Relation Age of Onset   Diabetes Mother    Thyroid disease Mother        Questionable   Colon cancer Mother        Questionable   Heart attack Father    Deep vein thrombosis Father    Diabetes Sister    Breast cancer Neg Hx    Social History   Socioeconomic History   Marital status: Married    Spouse name: Not on file   Number of children: 3   Years of education: Not on file   Highest education level: Not on file  Occupational History   Not on file  Tobacco Use   Smoking status: Never   Smokeless tobacco: Never  Vaping Use   Vaping Use: Never used  Substance and Sexual  Activity   Alcohol use: No    Alcohol/week: 0.0 standard drinks of alcohol   Drug use: No   Sexual activity: Yes  Other Topics Concern   Not on file  Social History Narrative   She is married and has 3 daughters.   Social Determinants of Health   Financial Resource Strain: Low Risk  (11/16/2021)   Overall Financial Resource Strain (CARDIA)    Difficulty of Paying Living Expenses: Not hard at all  Food Insecurity: No Food Insecurity (11/16/2021)   Hunger Vital Sign    Worried About Running Out of Food in the Last Year: Never true    Ran Out of Food in the Last Year: Never true  Transportation Needs: No Transportation Needs (11/16/2021)   PRAPARE - Hydrologist (Medical): No    Lack of Transportation (Non-Medical): No  Physical Activity:  Sufficiently Active (11/16/2021)   Exercise Vital Sign    Days of Exercise per Week: 5 days    Minutes of Exercise per Session: 30 min  Stress: No Stress Concern Present (11/16/2021)   [place]    Feeling of Stress : Not at all  Social Connections: Unknown (11/16/2021)   Social Connection and Isolation Panel [NHANES]    Frequency of Communication with Friends and Family: More than three times a week    Frequency of Social Gatherings with Friends and Family: More than three times a week    Attends Religious Services: Not on Advertising copywriter or Organizations: Not on file    Attends Archivist Meetings: Not on file    Marital Status: Married    Tobacco Counseling Counseling given: Not Answered   Clinical Intake:  Pre-visit preparation completed: Yes        Diabetes: No  How often do you need to have someone help you when you read instructions, pamphlets, or other written materials from your doctor or pharmacy?: 1 - Never Interpreter Needed?: No      Activities of Daily Living    11/16/2021    9:06 AM  In your present state of health, do you have any difficulty performing the following activities:  Hearing? 0  Vision? 0  Difficulty concentrating or making decisions? 0  Walking or climbing stairs? 0  Dressing or bathing? 0  Doing errands, shopping? 0  Preparing Food and eating ? N  Using the Toilet? N  In the past six months, have you accidently leaked urine? N  Do you have problems with loss of bowel control? N  Managing your Medications? N  Managing your Finances? N  Housekeeping or managing your Housekeeping? N    Patient Care Team: Einar Pheasant, MD as PCP - General (Internal Medicine)  Indicate any recent Medical Services you may have received from other than [facility] providers in the past year (date may be approximate).     Assessment:   This is a routine wellness  examination for [name].  Virtual Visit via Telephone Note  I connected with  Beaux B Fabio on 51/[birth year]/41 at  9:00 AM EDT by telephone and verified that I am speaking with the correct person using two identifiers.  Persons participating in the virtual visit: patient/Nurse Health Advisor   I discussed the limitations of performing an evaluation and management service by telehealth. We continued and completed visit with audio only. Some vital signs may be absent or patient reported.  Hearing/Vision screen No results found.  Dietary issues and exercise activities discussed: Current Exercise Habits: Structured exercise class, Type of exercise: strength training/weights;stretching;walking (Teaches dance class, plays tennis), Time (Minutes): 30, Frequency (Times/Week): 5, Weekly Exercise (Minutes/Week): 150, Intensity: Mild Healthy diet Good water intake   Goals Addressed               This Visit's Progress     Patient Stated     I would like to stretch more before/after exercise (pt-stated)   On track     Other     Follow up with Primary Care Provider   On track     As needed       Depression Screen    11/16/2021    9:05 AM 11/15/2020    8:26 AM 02/18/2020    8:18 AM 11/13/2019    8:44 AM 02/17/2019   10:13 AM 11/06/2018    9:10 AM 10/31/2017   10:23 AM  PHQ 2/9 Scores  PHQ - 2 Score 0 0 0 0 0 0 0    Fall Risk    11/15/2020    8:29 AM 02/18/2020    8:18 AM 11/13/2019    8:44 AM 02/17/2019   10:12 AM 11/06/2018    9:10 AM  Fall Risk   Falls in the past year? 0 0 0 0 0  Number falls in past yr: 0 0 0    Injury with Fall? 0 0     Follow up Falls evaluation completed Falls evaluation completed Falls evaluation completed Falls evaluation completed     [place]: Home free of loose throw rugs in walkways, pet beds, electrical cords, etc? Yes  Adequate lighting in your home to reduce risk of falls? Yes   ASSISTIVE DEVICES UTILIZED TO  PREVENT FALLS: Life alert? No  Use of a cane, walker or w/c? No   TIMED UP AND GO: Was the test performed? No .   Cognitive Function: Patient is alert and oriented x3.  Enjoys word games and other brain health stimulating activities.      11/13/2019    8:50 AM 10/31/2017   10:41 AM 10/25/2016   10:39 AM  MMSE - Mini Mental State Exam  Not completed: Unable to complete    Orientation to time  5 5  Orientation to Place  5 5  Registration  3 3  Attention/ Calculation  5 5  Recall  3 3  Language- name 2 objects  2 2  Language- repeat  1 1  Language- follow 3 step command  3 3  Language- read & follow direction  1 1  Write a sentence  1 1  Copy design  1 1  Total score  30 30        11/06/2018    9:12 AM  6CIT Screen  What Year? 0 points  What month? 0 points  What time? 0 points  Count back from 20 0 points  Months in reverse 0 points  Repeat phrase 0 points  Total Score 0 points    Immunizations Immunization History  Administered Date(s) Administered   Fluad Quad(high Dose 65+) 02/18/2020   Influenza Inj Mdck Quad Pf 03/16/2021   Influenza, High Dose Seasonal PF 06/07/2018   Influenza,inj,Quad PF,6+ Mos 06/25/2013, 06/25/2014, 06/28/2015   Influenza-Unspecified 06/14/2016, 02/26/2019   PFIZER(Purple Top)SARS-COV-2 Vaccination 06/12/2019, 07/03/2019, 03/08/2020   Pneumococcal Conjugate-13 06/25/2013   Pneumococcal Polysaccharide-23 06/28/2016   Zoster Recombinat (Shingrix) 05/16/2017, 08/28/2017  TDAP status: Due, Education has been provided regarding the importance of this vaccine. Advised may receive this vaccine at local pharmacy or Health Dept. Aware to provide a copy of the vaccination record if obtained from local pharmacy or Health Dept. Verbalized acceptance and understanding.  Screening Tests Health Maintenance  Topic Date Due   COVID-19 Vaccine (4 - Pfizer series) 12/02/2021 (Originally 05/03/2020)   TETANUS/TDAP  11/17/2022 (Originally 12/07/1964)    INFLUENZA VACCINE  01/03/2022   Pneumonia Vaccine 18+ Years old  Completed   DEXA SCAN  Completed   Hepatitis C Screening  Completed   Zoster Vaccines- Shingrix  Completed   HPV VACCINES  Aged Out   COLONOSCOPY (Pts 45-62yr Insurance coverage will need to be confirmed)  Discontinued   Health Maintenance There are no preventive care reminders to display for this patient.  Mammogram- completed 01/2022.   Lung Cancer Screening: (Low Dose CT Chest recommended if Age 78-80years, 30 pack-year currently smoking OR have quit w/in 15years.) does not qualify.   Vision Screening: Recommended annual ophthalmology exams for early detection of glaucoma and other disorders of the eye.  Dental Screening: Recommended annual dental exams for proper oral hygiene  Community Resource Referral / Chronic Care Management: CRR required this visit?  No   CCM required this visit?  No      Plan:   Keep all routine maintenance appointments.   Patient reports she is wearing hand/wrist brace as directed at bedtime for numbness and burning previously discussed with PCP. No further symptoms at night. During the day some numbness still present for thumb, first and second finger. No problem with daily living or doing big things. Mostly a difference in feel or sensitivity when doing the smaller things. Nurse encourages to call PCP if symptoms worsen and as needed. Patient agrees.   I have personally reviewed and noted the following in the patient's chart:   Medical and social history Use of alcohol, tobacco or illicit drugs  Current medications and supplements including opioid prescriptions.  Functional ability and status Nutritional status Physical activity Advanced directives List of other physicians Hospitalizations, surgeries, and ER visits in previous 12 months Vitals Screenings to include cognitive, depression, and falls Referrals and appointments  In addition, I have reviewed and discussed with  patient certain preventive protocols, quality metrics, and best practice recommendations. A written personalized care plan for preventive services as well as general preventive health recommendations were provided to patient.     OVarney Biles LPN   [id number]

## 2021-11-16 NOTE — Patient Instructions (Addendum)
  Sonya Hernandez , Thank you for taking time to come for your Medicare Wellness Visit. I appreciate your ongoing commitment to your health goals. Please review the following plan we discussed and let me know if I can assist you in the future.   These are the goals we discussed:  Goals       Patient Stated     I would like to stretch more before/after exercise (pt-stated)      Other     Follow up with Primary Care Provider      As needed        This is a list of the screening recommended for you and due dates:  Health Maintenance  Topic Date Due   COVID-19 Vaccine (4 - Pfizer series) 12/02/2021*   Tetanus Vaccine  11/17/2022*   Flu Shot  01/03/2022   Pneumonia Vaccine  Completed   DEXA scan (bone density measurement)  Completed   Hepatitis C Screening: USPSTF Recommendation to screen - Ages 50-79 yo.  Completed   Zoster (Shingles) Vaccine  Completed   HPV Vaccine  Aged Out   Colon Cancer Screening  Discontinued  *Topic was postponed. The date shown is not the original due date.

## 2021-12-01 DIAGNOSIS — M810 Age-related osteoporosis without current pathological fracture: Secondary | ICD-10-CM | POA: Diagnosis not present

## 2021-12-22 ENCOUNTER — Other Ambulatory Visit: Payer: Self-pay | Admitting: Internal Medicine

## 2022-01-17 ENCOUNTER — Other Ambulatory Visit: Payer: Self-pay | Admitting: Internal Medicine

## 2022-01-17 DIAGNOSIS — Z1231 Encounter for screening mammogram for malignant neoplasm of breast: Secondary | ICD-10-CM

## 2022-02-14 ENCOUNTER — Ambulatory Visit
Admission: RE | Admit: 2022-02-14 | Discharge: 2022-02-14 | Disposition: A | Discharge status: Home / Self Care | Payer: Medicare HMO | Source: Ambulatory Visit | Attending: Internal Medicine | Admitting: Internal Medicine

## 2022-02-14 DIAGNOSIS — Z1231 Encounter for screening mammogram for malignant neoplasm of breast: Secondary | ICD-10-CM | POA: Diagnosis not present

## 2022-03-31 DIAGNOSIS — Z85828 Personal history of other malignant neoplasm of skin: Secondary | ICD-10-CM | POA: Diagnosis not present

## 2022-03-31 DIAGNOSIS — D2262 Melanocytic nevi of left upper limb, including shoulder: Secondary | ICD-10-CM | POA: Diagnosis not present

## 2022-03-31 DIAGNOSIS — D225 Melanocytic nevi of trunk: Secondary | ICD-10-CM | POA: Diagnosis not present

## 2022-03-31 DIAGNOSIS — L821 Other seborrheic keratosis: Secondary | ICD-10-CM | POA: Diagnosis not present

## 2022-03-31 DIAGNOSIS — L570 Actinic keratosis: Secondary | ICD-10-CM | POA: Diagnosis not present

## 2022-03-31 DIAGNOSIS — D2261 Melanocytic nevi of right upper limb, including shoulder: Secondary | ICD-10-CM | POA: Diagnosis not present

## 2022-03-31 DIAGNOSIS — X32XXXA Exposure to sunlight, initial encounter: Secondary | ICD-10-CM | POA: Diagnosis not present

## 2022-04-03 ENCOUNTER — Encounter: Payer: Self-pay | Admitting: Internal Medicine

## 2022-04-03 ENCOUNTER — Ambulatory Visit (INDEPENDENT_AMBULATORY_CARE_PROVIDER_SITE_OTHER): Payer: Medicare HMO | Admitting: Internal Medicine

## 2022-04-03 VITALS — BP 110/70 | HR 62 | Temp 98.7°F | Resp 17 | Ht 65.0 in | Wt 123.4 lb

## 2022-04-03 DIAGNOSIS — Z0000 Encounter for general adult medical examination without abnormal findings: Secondary | ICD-10-CM

## 2022-04-03 DIAGNOSIS — R200 Anesthesia of skin: Secondary | ICD-10-CM | POA: Diagnosis not present

## 2022-04-03 DIAGNOSIS — M810 Age-related osteoporosis without current pathological fracture: Secondary | ICD-10-CM

## 2022-04-03 DIAGNOSIS — D649 Anemia, unspecified: Secondary | ICD-10-CM

## 2022-04-03 DIAGNOSIS — Z23 Encounter for immunization: Secondary | ICD-10-CM | POA: Diagnosis not present

## 2022-04-03 DIAGNOSIS — E7800 Pure hypercholesterolemia, unspecified: Secondary | ICD-10-CM | POA: Diagnosis not present

## 2022-04-03 LAB — COMPREHENSIVE METABOLIC PANEL
ALT: 14 U/L (ref 0–35)
AST: 18 U/L (ref 0–37)
Albumin: 4 g/dL (ref 3.5–5.2)
Alkaline Phosphatase: 49 U/L (ref 39–117)
BUN: 18 mg/dL (ref 6–23)
CO2: 31 mEq/L (ref 19–32)
Calcium: 9.6 mg/dL (ref 8.4–10.5)
Chloride: 104 mEq/L (ref 96–112)
Creatinine, Ser: 0.74 mg/dL (ref 0.40–1.20)
GFR: 77.55 mL/min (ref >60.00)
Glucose, Bld: 88 mg/dL (ref 70–99)
Potassium: 4.3 mEq/L (ref 3.5–5.1)
Sodium: 139 mEq/L (ref 135–145)
Total Bilirubin: 0.4 mg/dL (ref 0.2–1.2)
Total Protein: 6.1 g/dL (ref 6.0–8.3)

## 2022-04-03 LAB — CBC WITH DIFFERENTIAL/PLATELET
Basophils Absolute: 0 10*3/uL (ref 0.0–0.1)
Basophils Relative: 0.3 % (ref 0.0–3.0)
Eosinophils Absolute: 0.3 10*3/uL (ref 0.0–0.7)
Eosinophils Relative: 6.6 % — ABNORMAL HIGH (ref 0.0–5.0)
HCT: 38.6 % (ref 36.0–46.0)
Hemoglobin: 12.8 g/dL (ref 12.0–15.0)
Lymphocytes Relative: 44.3 % (ref 12.0–46.0)
Lymphs Abs: 2.3 10*3/uL (ref 0.7–4.0)
MCHC: 33.1 g/dL (ref 30.0–36.0)
MCV: 95.9 fl (ref 78.0–100.0)
Monocytes Absolute: 0.5 10*3/uL (ref 0.1–1.0)
Monocytes Relative: 8.8 % (ref 3.0–12.0)
Neutro Abs: 2.1 10*3/uL (ref 1.4–7.7)
Neutrophils Relative %: 40 % — ABNORMAL LOW (ref 43.0–77.0)
Platelets: 205 10*3/uL (ref 150.0–400.0)
RBC: 4.02 Mil/uL (ref 3.87–5.11)
RDW: 13.5 % (ref 11.5–15.5)
WBC: 5.2 10*3/uL (ref 4.0–10.5)

## 2022-04-03 LAB — LIPID PANEL
Cholesterol: 159 mg/dL (ref 0–200)
HDL: 57.9 mg/dL (ref >39.00)
LDL Cholesterol: 89 mg/dL (ref 0–99)
NonHDL: 101.27
Total CHOL/HDL Ratio: 3
Triglycerides: 62 mg/dL (ref 0.0–149.0)
VLDL: 12.4 mg/dL (ref 0.0–40.0)

## 2022-04-03 LAB — TSH: TSH: 3.67 u[IU]/mL (ref 0.35–5.50)

## 2022-04-03 NOTE — Assessment & Plan Note (Signed)
Physical today 04/03/22.  Mammogram 02/14/22 - Birads I.  Colonoscopy 11/2019 (Dr Alice Reichert).  Receiving prolia.  Next bone density 10/2022

## 2022-04-03 NOTE — Progress Notes (Signed)
Patient ID: Lynnet B Stamas, female   DOB: Aug 28, 1945, 76 y.o.   MRN: [id number]   Subjective:    Patient ID: Krizia B Mcmichael, female    DOB: 1945/10/29, 76 y.o.   MRN: [id number]   Patient here for  Chief Complaint  Patient presents with   Annual Exam    CPE   .   HPI Here for her physical exam. Seeing endocrinology.  Receiving prolia injections.  Net DEXA 10/2022.  Last prolia 12/01/21.  Previous hand numbness.  Instructed to use cock up wrist splint. Improved symptoms when sleeping.  When awake, still with numbness left fingers 2-4.  Discussed NCS. Stays active.  No chest pain or sob reported.  No abdominal pain or bowel change reported.  Overall feels good.     Past Medical History:  Diagnosis Date   Anemia    Cancer ([place])    skin   Osteoporosis    Past Surgical History:  Procedure Laterality Date   NO PAST SURGERIES     Family History  Problem Relation Age of Onset   Diabetes Mother    Thyroid disease Mother        Questionable   Colon cancer Mother        Questionable   Heart attack Father    Deep vein thrombosis Father    Diabetes Sister    Breast cancer Neg Hx    Social History   Socioeconomic History   Marital status: Married    Spouse name: Not on file   Number of children: 3   Years of education: Not on file   Highest education level: Not on file  Occupational History   Not on file  Tobacco Use   Smoking status: Never   Smokeless tobacco: Never  Vaping Use   Vaping Use: Never used  Substance and Sexual Activity   Alcohol use: No    Alcohol/week: 0.0 standard drinks of alcohol   Drug use: No   Sexual activity: Yes  Other Topics Concern   Not on file  Social History Narrative   She is married and has 3 daughters.   Social Determinants of Health   Financial Resource Strain: Low Risk  (11/16/2021)   Overall Financial Resource Strain (CARDIA)    Difficulty of Paying Living Expenses: Not hard at all  Food Insecurity: No Food Insecurity  (11/16/2021)   Hunger Vital Sign    Worried About Running Out of Food in the Last Year: Never true    Ran Out of Food in the Last Year: Never true  Transportation Needs: No Transportation Needs (11/16/2021)   PRAPARE - Hydrologist (Medical): No    Lack of Transportation (Non-Medical): No  Physical Activity: Sufficiently Active (11/16/2021)   Exercise Vital Sign    Days of Exercise per Week: 5 days    Minutes of Exercise per Session: 30 min  Stress: No Stress Concern Present (11/16/2021)   [place]    Feeling of Stress : Not at all  Social Connections: Unknown (11/16/2021)   Social Connection and Isolation Panel [NHANES]    Frequency of Communication with Friends and Family: More than three times a week    Frequency of Social Gatherings with Friends and Family: More than three times a week    Attends Religious Services: Not on file    Active Member of Clubs or Organizations: Not on file    Attends Club or  Organization Meetings: Not on file    Marital Status: Married     Review of Systems  Constitutional:  Negative for fatigue and unexpected weight change.  HENT:  Negative for congestion, sinus pressure and sore throat.   Eyes:  Negative for pain and visual disturbance.  Respiratory:  Negative for cough, chest tightness and shortness of breath.   Cardiovascular:  Negative for chest pain, palpitations and leg swelling.  Gastrointestinal:  Negative for abdominal pain, diarrhea, nausea and vomiting.  Genitourinary:  Negative for difficulty urinating and dysuria.  Musculoskeletal:  Negative for joint swelling and myalgias.  Skin:  Negative for color change and rash.  Neurological:  Negative for dizziness and headaches.  Hematological:  Negative for adenopathy. Does not bruise/bleed easily.  Psychiatric/Behavioral:  Negative for agitation and dysphoric mood.        Objective:     BP  110/70 (BP Location: Left Arm, Patient Position: Sitting, Cuff Size: Small)   Pulse 62   Temp 98.7 F (37.1 C) (Temporal)   Resp 17   Ht '5\' 5"'$  (1.651 m)   Wt 123 lb 6.4 oz (56 kg)   LMP 06/24/1993   SpO2 96%   BMI 20.53 kg/m  Wt Readings from Last 3 Encounters:  04/03/22 123 lb 6.4 oz (56 kg)  11/16/21 124 lb (56.2 kg)  04/01/21 124 lb 3.2 oz (56.3 kg)    Physical Exam Vitals reviewed.  Constitutional:      General: She is not in acute distress.    Appearance: Normal appearance. She is well-developed.  HENT:     Head: Normocephalic and atraumatic.     Right Ear: External ear normal.     Left Ear: External ear normal.  Eyes:     General: No scleral icterus.       Right eye: No discharge.        Left eye: No discharge.     Conjunctiva/sclera: Conjunctivae normal.  Neck:     Thyroid: No thyromegaly.  Cardiovascular:     Rate and Rhythm: Normal rate and regular rhythm.  Pulmonary:     Effort: No tachypnea, accessory muscle usage or respiratory distress.     Breath sounds: Normal breath sounds. No decreased breath sounds or wheezing.  Chest:  Breasts:    Right: No inverted nipple, mass, nipple discharge or tenderness (no axillary adenopathy).     Left: No inverted nipple, mass, nipple discharge or tenderness (no axilarry adenopathy).  Abdominal:     General: Bowel sounds are normal.     Palpations: Abdomen is soft.     Tenderness: There is no abdominal tenderness.  Musculoskeletal:        General: No swelling or tenderness.     Cervical back: Neck supple. No tenderness.  Lymphadenopathy:     Cervical: No cervical adenopathy.  Skin:    Findings: No erythema or rash.  Neurological:     Mental Status: She is alert and oriented to person, place, and time.  Psychiatric:        Mood and Affect: Mood normal.        Behavior: Behavior normal.      Outpatient Encounter Medications as of 04/03/2022  Medication Sig   Calcium Carbonate-Vitamin D 600-400 MG-UNIT per chew  tablet Chew 1 tablet by mouth 2 (two) times daily.    Cholecalciferol 1000 UNITS capsule Take 1,000 Units by mouth daily.   clobetasol cream (TEMOVATE) 9.37 % Apply 1 application topically 2 (two) times a week.  denosumab (PROLIA) 60 MG/ML SOLN injection Inject 60 mg into the skin every 6 (six) months. Administer in upper arm, thigh, or abdomen   estradiol (ESTRACE) 0.1 MG/GM vaginal cream Place 2 g vaginally 3 (three) times a week.    ferrous sulfate 325 (65 FE) MG EC tablet Take 325 mg by mouth every other day.    rosuvastatin (CRESTOR) 10 MG tablet Take 1 tablet (10 mg total) by mouth daily.   No facility-administered encounter medications on file as of 04/03/2022.     Lab Results  Component Value Date   WBC 5.2 04/03/2022   HGB 12.8 04/03/2022   HCT 38.6 04/03/2022   PLT 205.0 04/03/2022   GLUCOSE 88 04/03/2022   CHOL 159 04/03/2022   TRIG 62.0 04/03/2022   HDL 57.90 04/03/2022   LDLDIRECT 157.7 06/24/2012   LDLCALC 89 04/03/2022   ALT 14 04/03/2022   AST 18 04/03/2022   NA 139 04/03/2022   K 4.3 04/03/2022   CL 104 04/03/2022   CREATININE 0.74 04/03/2022   BUN 18 04/03/2022   CO2 31 04/03/2022   TSH 3.67 04/03/2022    MM 3D SCREEN BREAST BILATERAL  Result Date: 02/15/2022 CLINICAL DATA:  Screening. EXAM: DIGITAL SCREENING BILATERAL MAMMOGRAM WITH TOMOSYNTHESIS AND CAD TECHNIQUE: Bilateral screening digital craniocaudal and mediolateral oblique mammograms were obtained. Bilateral screening digital breast tomosynthesis was performed. The images were evaluated with computer-aided detection. COMPARISON:  Previous exam(s). ACR Breast Density Category b: There are scattered areas of fibroglandular density. FINDINGS: There are no findings suspicious for malignancy. IMPRESSION: No mammographic evidence of malignancy. A result letter of this screening mammogram will be mailed directly to the patient. RECOMMENDATION: Screening mammogram in one year. (Code:SM-B-01Y) BI-RADS CATEGORY   1: Negative. Electronically Signed   By: Everlean Alstrom M.D.   On: 02/15/2022 11:24       Assessment & Plan:   Problem List Items Addressed This Visit     Anemia    Follow cbc.       Hand numbness    Previous hand numbness.  Instructed to use cock up wrist splint. Improved symptoms when sleeping.  When awake, still with numbness left fingers 2-4.  Discussed NCS. Agreeable.  Order placed.       Relevant Orders   Ambulatory referral to Neurology   Health care maintenance    Physical today 04/03/22.  Mammogram 02/14/22 - Birads I.  Colonoscopy 11/2019 (Dr Alice Reichert).  Receiving prolia.  Next bone density 10/2022      Hypercholesteremia    On crestor.  Follow lipid panel and liver function tests.        Osteoporosis    Continue prolia.  Last prolia 12/01/21.  Plan for f/u DEXA 10/2022.       Relevant Orders   CBC with Differential/Platelet (Completed)   Comprehensive metabolic panel (Completed)   Lipid panel (Completed)   TSH (Completed)   Other Visit Diagnoses     Routine general medical examination at a health care facility    -  Primary   Influenza vaccine needed       Relevant Orders   Flu Vaccine QUAD High Dose(Fluad) (Completed)        Einar Pheasant, MD

## 2022-04-06 ENCOUNTER — Telehealth: Payer: Self-pay | Admitting: Internal Medicine

## 2022-04-06 ENCOUNTER — Other Ambulatory Visit: Payer: Self-pay

## 2022-04-06 DIAGNOSIS — E7800 Pure hypercholesterolemia, unspecified: Secondary | ICD-10-CM

## 2022-04-06 NOTE — Telephone Encounter (Signed)
On the result note, I had stated repeat lipid panel and liver panel in 6 weeks.  I meant 6 months.  Please notify pt and reschedule lab appt to 6 months - not 6 weeks.  Thanks

## 2022-04-07 NOTE — Telephone Encounter (Signed)
Rescheduled for 09/18/22

## 2022-04-09 ENCOUNTER — Encounter: Payer: Self-pay | Admitting: Internal Medicine

## 2022-04-09 NOTE — Assessment & Plan Note (Signed)
Previous hand numbness.  Instructed to use cock up wrist splint. Improved symptoms when sleeping.  When awake, still with numbness left fingers 2-4.  Discussed NCS. Agreeable.  Order placed.

## 2022-04-09 NOTE — Assessment & Plan Note (Signed)
On crestor.  Follow lipid panel and liver function tests.   

## 2022-04-09 NOTE — Assessment & Plan Note (Signed)
Follow cbc.  

## 2022-04-09 NOTE — Assessment & Plan Note (Signed)
Continue prolia.  Last prolia 12/01/21.  Plan for f/u DEXA 10/2022.

## 2022-05-24 ENCOUNTER — Other Ambulatory Visit: Payer: Medicare HMO

## 2022-06-07 DIAGNOSIS — M810 Age-related osteoporosis without current pathological fracture: Secondary | ICD-10-CM | POA: Diagnosis not present

## 2022-06-21 DIAGNOSIS — H33309 Unspecified retinal break, unspecified eye: Secondary | ICD-10-CM | POA: Diagnosis not present

## 2022-06-21 DIAGNOSIS — H26499 Other secondary cataract, unspecified eye: Secondary | ICD-10-CM | POA: Diagnosis not present

## 2022-06-21 DIAGNOSIS — Z961 Presence of intraocular lens: Secondary | ICD-10-CM | POA: Diagnosis not present

## 2022-07-05 DIAGNOSIS — R202 Paresthesia of skin: Secondary | ICD-10-CM | POA: Diagnosis not present

## 2022-07-05 DIAGNOSIS — R7309 Other abnormal glucose: Secondary | ICD-10-CM | POA: Diagnosis not present

## 2022-07-19 DIAGNOSIS — E538 Deficiency of other specified B group vitamins: Secondary | ICD-10-CM | POA: Diagnosis not present

## 2022-08-22 DIAGNOSIS — R200 Anesthesia of skin: Secondary | ICD-10-CM | POA: Diagnosis not present

## 2022-09-18 ENCOUNTER — Other Ambulatory Visit (INDEPENDENT_AMBULATORY_CARE_PROVIDER_SITE_OTHER): Payer: Medicare HMO

## 2022-09-18 DIAGNOSIS — E7800 Pure hypercholesterolemia, unspecified: Secondary | ICD-10-CM | POA: Diagnosis not present

## 2022-09-18 LAB — HEPATIC FUNCTION PANEL
ALT: 19 U/L (ref 0–35)
AST: 22 U/L (ref 0–37)
Albumin: 4 g/dL (ref 3.5–5.2)
Alkaline Phosphatase: 50 U/L (ref 39–117)
Bilirubin, Direct: 0.1 mg/dL (ref 0.0–0.3)
Total Bilirubin: 0.5 mg/dL (ref 0.2–1.2)
Total Protein: 6.1 g/dL (ref 6.0–8.3)

## 2022-09-18 LAB — LIPID PANEL
Cholesterol: 160 mg/dL (ref 0–200)
HDL: 58.9 mg/dL (ref >39.00)
LDL Cholesterol: 85 mg/dL (ref 0–99)
NonHDL: 100.61
Total CHOL/HDL Ratio: 3
Triglycerides: 79 mg/dL (ref 0.0–149.0)
VLDL: 15.8 mg/dL (ref 0.0–40.0)

## 2022-09-25 ENCOUNTER — Encounter: Payer: Self-pay | Admitting: Internal Medicine

## 2022-09-25 NOTE — Telephone Encounter (Signed)
Please schedule Sonya Hernandez for an appt.  See attched.

## 2022-11-20 ENCOUNTER — Ambulatory Visit (INDEPENDENT_AMBULATORY_CARE_PROVIDER_SITE_OTHER): Payer: Medicare HMO

## 2022-11-20 VITALS — BP 110/70 | Ht 65.5 in | Wt 130.0 lb

## 2022-11-20 DIAGNOSIS — Z1231 Encounter for screening mammogram for malignant neoplasm of breast: Secondary | ICD-10-CM | POA: Diagnosis not present

## 2022-11-20 DIAGNOSIS — Z0000 Encounter for general adult medical examination without abnormal findings: Secondary | ICD-10-CM | POA: Diagnosis not present

## 2022-11-20 NOTE — Progress Notes (Signed)
 I connected with  [name] on 11/20/22 by a audio enabled telemedicine application and verified that I am speaking with the correct person using two identifiers.  Patient Location: Home  Provider Location: Home Office  I discussed the limitations of evaluation and management by telemedicine. The patient expressed understanding and agreed to proceed.  Subjective:   Sonya Hernandez is a 77 y.o. female who presents for Medicare Annual (Subsequent) preventive examination.  Review of Systems     Cardiac Risk Factors include: advanced age (>36men, >110 women);dyslipidemia     Objective:    Today's Vitals   11/20/22 0921  BP: 110/70  Weight: 130 lb (59 kg)  Height: 5' 5.5" (1.664 m)   Body mass index is 21.3 kg/m.     11/20/2022    9:28 AM 11/16/2021    9:01 AM 11/15/2020    8:27 AM 11/13/2019    8:50 AM 11/06/2018    9:09 AM 10/31/2017   10:28 AM 10/25/2016   10:13 AM  Advanced Directives  Does Patient Have a Medical Advance Directive? No Yes Yes Yes Yes Yes Yes  Type of Furniture conservator/restorer;Living will Healthcare Power of [place];Living will Healthcare Power of [place];Living will Healthcare Power of [place];Living will Healthcare Power of [place];Living will Healthcare Power of [place];Living will  Does patient want to make changes to medical advance directive?  No - Patient declined No - Patient declined No - Patient declined No - Patient declined No - Patient declined No - Patient declined  Copy of Healthcare Power of Attorney in Chart?  No - copy requested No - copy requested No - copy requested No - copy requested No - copy requested No - copy requested  Would patient like information on creating a medical advance directive? No - Patient declined          Current Medications (verified) Outpatient Encounter Medications as of 11/20/2022  Medication Sig   Calcium Carbonate-Vitamin D 600-400 MG-UNIT per chew tablet Chew 1 tablet by  mouth 2 (two) times daily.    Cholecalciferol 1000 UNITS capsule Take 1,000 Units by mouth daily.   clobetasol cream (TEMOVATE) 0.05 % Apply 1 application topically 2 (two) times a week.   denosumab (PROLIA) 60 MG/ML SOLN injection Inject 60 mg into the skin every 6 (six) months. Administer in upper arm, thigh, or abdomen   estradiol (ESTRACE) 0.1 MG/GM vaginal cream Place 2 g vaginally 3 (three) times a week.    ferrous sulfate 325 (65 FE) MG EC tablet Take 325 mg by mouth every other day.    rosuvastatin (CRESTOR) 10 MG tablet Take 1 tablet (10 mg total) by mouth daily.   No facility-administered encounter medications on file as of 11/20/2022.    Allergies (verified) Patient has no known allergies.   History: Past Medical History:  Diagnosis Date   Anemia    Cancer ([facility])    skin   Osteoporosis    Past Surgical History:  Procedure Laterality Date   NO PAST SURGERIES     Family History  Problem Relation Age of Onset   Diabetes Mother    Thyroid disease Mother        Questionable   Colon cancer Mother        Questionable   Heart attack Father    Deep vein thrombosis Father    Diabetes Sister    Breast cancer Neg Hx    Social History   Socioeconomic History  Marital status: Married    Spouse name: Not on file   Number of children: 3   Years of education: Not on file   Highest education level: Not on file  Occupational History   Not on file  Tobacco Use   Smoking status: Never   Smokeless tobacco: Never  Vaping Use   Vaping Use: Never used  Substance and Sexual Activity   Alcohol use: No    Alcohol/week: 0.0 standard drinks of alcohol   Drug use: No   Sexual activity: Yes  Other Topics Concern   Not on file  Social History Narrative   She is married and has 3 daughters.   Social Determinants of Health   Financial Resource Strain: Low Risk  (11/20/2022)   Overall Financial Resource Strain (CARDIA)    Difficulty of Paying Living Expenses: Not hard at all   Food Insecurity: No Food Insecurity (11/20/2022)   Hunger Vital Sign    Worried About Running Out of Food in the Last Year: Never true    Ran Out of Food in the Last Year: Never true  Transportation Needs: No Transportation Needs (11/20/2022)   PRAPARE - Administrator, Civil Service (Medical): No    Lack of Transportation (Non-Medical): No  Physical Activity: Sufficiently Active (11/20/2022)   Exercise Vital Sign    Days of Exercise per Week: 7 days    Minutes of Exercise per Session: 30 min  Stress: No Stress Concern Present (11/20/2022)   [employer] of Occupational Health - Occupational Stress Questionnaire    Feeling of Stress : Not at all  Social Connections: Socially Integrated (11/20/2022)   Social Connection and Isolation Panel [NHANES]    Frequency of Communication with Friends and Family: More than three times a week    Frequency of Social Gatherings with Friends and Family: More than three times a week    Attends Religious Services: More than 4 times per year    Active Member of [employer] or Organizations: Yes    Attends Engineer, structural: More than 4 times per year    Marital Status: Married    Tobacco Counseling Counseling given: Yes   Clinical Intake:  Pre-visit preparation completed: Yes  Pain : No/denies pain     BMI - recorded: 21.3 Nutritional Status: BMI of 19-24  Normal Nutritional Risks: None Diabetes: No  How often do you need to have someone help you when you read instructions, pamphlets, or other written materials from your doctor or pharmacy?: 1 - Never  Diabetic?no  Interpreter Needed?: No  Information entered by ::  Erich Kochan, CMA   Activities of Daily Living    11/20/2022    9:28 AM  In your present state of health, do you have any difficulty performing the following activities:  Hearing? 0  Vision? 0  Difficulty concentrating or making decisions? 0  Walking or climbing stairs? 0  Dressing or bathing? 0   Doing errands, shopping? 0  Preparing Food and eating ? N  Using the Toilet? N  In the past six months, have you accidently leaked urine? N  Do you have problems with loss of bowel control? N  Managing your Medications? N  Managing your Finances? N  Housekeeping or managing your Housekeeping? N    Patient Care Team: Dale Duque, MD as PCP - General (Internal Medicine)  Indicate any recent Medical Services you may have received from other than [facility] providers in the past year (date may be approximate).  Assessment:   This is a routine wellness examination for Alanni.  Hearing/Vision screen Hearing Screening - Comments:: Patient denies any hearing difficulties.   Vision Screening - Comments:: Patient states they wear reading glasses only.    Dietary issues and exercise activities discussed: Current Exercise Habits: Home exercise routine, Type of exercise: walking, Time (Minutes): 30, Frequency (Times/Week): 7, Weekly Exercise (Minutes/Week): 210, Intensity: Mild, Exercise limited by: None identified   Goals Addressed             This Visit's Progress    Patient Stated       Patients goal is to remain healthy.       Depression Screen    11/20/2022    9:26 AM 04/03/2022    8:31 AM 11/16/2021    9:05 AM 11/15/2020    8:26 AM 02/18/2020    8:18 AM 11/13/2019    8:44 AM 02/17/2019   10:13 AM  PHQ 2/9 Scores  PHQ - 2 Score 0 0 0 0 0 0 0    Fall Risk    11/20/2022    9:28 AM 04/03/2022    8:31 AM 11/15/2020    8:29 AM 02/18/2020    8:18 AM 11/13/2019    8:44 AM  Fall Risk   Falls in the past year? 0 0 0 0 0  Number falls in past yr: 0 0 0 0 0  Injury with Fall? 0 0 0 0   Risk for fall due to : No Fall Risks No Fall Risks     Follow up Falls prevention discussed Falls evaluation completed Falls evaluation completed Falls evaluation completed Falls evaluation completed    FALL RISK PREVENTION PERTAINING TO THE HOME:  Any stairs in or around the home? No   If so, are there any without handrails? No  Home free of loose throw rugs in walkways, pet beds, electrical cords, etc? Yes  Adequate lighting in your home to reduce risk of falls? Yes   ASSISTIVE DEVICES UTILIZED TO PREVENT FALLS:  Life alert? No  Use of a cane, walker or w/c? No  Grab bars in the bathroom? No  Shower chair or bench in shower? No  Elevated toilet seat or a handicapped toilet? No   TIMED UP AND GO:  Was the test performed? No .  Cognitive Function:    11/13/2019    8:50 AM 10/31/2017   10:41 AM 10/25/2016   10:39 AM  MMSE - Mini Mental State Exam  Not completed: Unable to complete    Orientation to time  5 5  Orientation to Place  5 5  Registration  3 3  Attention/ Calculation  5 5  Recall  3 3  Language- name 2 objects  2 2  Language- repeat  1 1  Language- follow 3 step command  3 3  Language- read & follow direction  1 1  Write a sentence  1 1  Copy design  1 1  Total score  30 30        11/20/2022    9:29 AM 11/06/2018    9:12 AM  6CIT Screen  What Year? 0 points 0 points  What month? 0 points 0 points  What time? 0 points 0 points  Count back from 20 0 points 0 points  Months in reverse 0 points 0 points  Repeat phrase 0 points 0 points  Total Score 0 points 0 points    Immunizations Immunization History  Administered Date(s) Administered  Fluad Quad(high Dose 65+) 02/18/2020, 04/03/2022   Influenza Inj Mdck Quad Pf 03/16/2021   Influenza, High Dose Seasonal PF 06/07/2018   Influenza,inj,Quad PF,6+ Mos 06/25/2013, 06/25/2014, 06/28/2015   Influenza-Unspecified 06/14/2016, 02/26/2019   PFIZER(Purple Top)SARS-COV-2 Vaccination 06/12/2019, 07/03/2019, 03/08/2020   Pneumococcal Conjugate-13 06/25/2013   Pneumococcal Polysaccharide-23 06/28/2016   Zoster Recombinat (Shingrix) 05/16/2017, 08/28/2017    TDAP status: Due, Education has been provided regarding the importance of this vaccine. Advised may receive this vaccine at local pharmacy  or Health Dept. Aware to provide a copy of the vaccination record if obtained from local pharmacy or Health Dept. Verbalized acceptance and understanding.  Flu Vaccine status: Up to date  Pneumococcal vaccine status: Up to date  Covid-19 vaccine status: Information provided on how to obtain vaccines.   Qualifies for Shingles Vaccine? Yes   Zostavax completed Yes   Shingrix Completed?: Yes  Screening Tests Health Maintenance  Topic Date Due   DTaP/Tdap/Td (1 - Tdap) Never done   COVID-19 Vaccine (4 - 2023-24 season) 02/03/2022   INFLUENZA VACCINE  01/04/2023   Medicare Annual Wellness (AWV)  11/20/2023   Pneumonia Vaccine 69+ Years old  Completed   DEXA SCAN  Completed   Hepatitis C Screening  Completed   Zoster Vaccines- Shingrix  Completed   HPV VACCINES  Aged Out   Colonoscopy  Discontinued    Health Maintenance  Health Maintenance Due  Topic Date Due   DTaP/Tdap/Td (1 - Tdap) Never done   COVID-19 Vaccine (4 - 2023-24 season) 02/03/2022    Colorectal cancer screening: Type of screening: Colonoscopy. Completed 11/14/2019. Repeat every d/c years  Mammogram status: Completed 02/14/2022. Repeat every year Order placed today   Bone Density status: Completed 2022. Results reflect: Bone density results: OSTEOPOROSIS. Repeat every 2 years.Patient has an appt already scheduled. Gets this done at [facility]  Lung Cancer Screening: (Low Dose CT Chest recommended if Age 47-80 years, 30 pack-year currently smoking OR have quit w/in 15years.) does not qualify.    Additional Screening:  Hepatitis C Screening: does not qualify; Completed 02/17/2019  Vision Screening: Recommended annual ophthalmology exams for early detection of glaucoma and other disorders of the eye. Is the patient up to date with their annual eye exam?  Yes  Who is the provider or what is the name of the office in which the patient attends annual eye exams?  If pt is not established with a provider, would  they like to be referred to a provider to establish care? No .   Dental Screening: Recommended annual dental exams for proper oral hygiene  Community Resource Referral / Chronic Care Management: CRR required this visit?  No   CCM required this visit?  No      Plan:     I have personally reviewed and noted the following in the patient's chart:   Medical and social history Use of alcohol, tobacco or illicit drugs  Current medications and supplements including opioid prescriptions. Patient is not currently taking opioid prescriptions. Functional ability and status Nutritional status Physical activity Advanced directives List of other physicians Hospitalizations, surgeries, and ER visits in previous 12 months Vitals Screenings to include cognitive, depression, and falls Referrals and appointments  In addition, I have reviewed and discussed with patient certain preventive protocols, quality metrics, and best practice recommendations. A written personalized care plan for preventive services as well as general preventive health recommendations were provided to patient.   Due to this visit being a telehealth visit, certain criteria  was not obtained, such a blood pressure, CBG if patient is a diabetic, and timed up and go.   Due to this being a telephonic visit, the after visit summary with patients personalized plan was offered to patient via mail or my-chart. Patient would like to access their AVS via my-chart    Jordan Hawks Danahi Reddish, CMA   11/20/2022   Nurse Notes: Dexa scan is up to date. Has done at the [facility]

## 2022-11-20 NOTE — Patient Instructions (Addendum)
Sonya Hernandez , Thank you for taking time to come for your Medicare Wellness Visit. I appreciate your ongoing commitment to your health goals. Please review the following plan we discussed and let me know if I can assist you in the future.   These are the goals we discussed:  Goals       Follow up with Primary Care Provider      As needed      I would like to stretch more before/after exercise (pt-stated)      Patient Stated      Patients goal is to remain healthy.        This is a list of the screening recommended for you and due dates:  Health Maintenance  Topic Date Due   DTaP/Tdap/Td vaccine (1 - Tdap) Never done   COVID-19 Vaccine (4 - 2023-24 season) 02/03/2022   Flu Shot  01/04/2023   Medicare Annual Wellness Visit  11/20/2023   Pneumonia Vaccine  Completed   DEXA scan (bone density measurement)  Completed   Hepatitis C Screening  Completed   Zoster (Shingles) Vaccine  Completed   HPV Vaccine  Aged Out   Colon Cancer Screening  Discontinued    Advanced directives: Advance directive discussed with you today. Even though you declined this today, please call our office should you change your mind, and we can give you the proper paperwork for you to fill out. Advance care planning is a way to make decisions about medical care that fits your values in case you are ever unable to make these decisions for yourself.  Information on Advanced Care Planning can be found at [facility] [place] Advance Health Care Directives Advance Health Care Directives ([email or web address]    Conditions/risks identified:  --You're due for a Tetanus booster and a Covid booster. Both of these can be done at your preferred pharmacy.   A mammogram has been ordered for you to have done in September. If you have not heard from them in the next 2 weeks, please call to schedule your appt.   [facility] Address: [address], [place], [place]  [address] Open ? Closes 5?PM Phone: [phone]   Next appointment:  Follow up in one year for your annual wellness visit  November 21, 2023 at 9:30am telephone visit   Preventive Care 65 Years and Older, Female Preventive care refers to lifestyle choices and visits with your health care provider that can promote health and wellness. What does preventive care include? A yearly physical exam. This is also called an annual well check. Dental exams once or twice a year. Routine eye exams. Ask your health care provider how often you should have your eyes checked. Personal lifestyle choices, including: Daily care of your teeth and gums. Regular physical activity. Eating a healthy diet. Avoiding tobacco and drug use. Limiting alcohol use. Practicing safe sex. Taking low-dose aspirin every day. Taking vitamin and mineral supplements as recommended by your health care provider. What happens during an annual well check? The services and screenings done by your health care provider during your annual well check will depend on your age, overall health, lifestyle risk factors, and family history of disease. Counseling  Your health care provider may ask you questions about your: Alcohol use. Tobacco use. Drug use. Emotional well-being. Home and relationship well-being. Sexual activity. Eating habits. History of falls. Memory and ability to understand (cognition). Work and work Astronomer. Reproductive health. Screening  You may have the following tests or measurements: Height, weight, and BMI. Blood pressure. Lipid and cholesterol levels. These may be checked every 5 years, or more frequently if you are over 85 years old. Skin check. Lung cancer screening. You may have this screening every year starting at age 7 if you have a 30-pack-year history of smoking and currently smoke or have quit within the past 15 years. Fecal occult blood test (FOBT) of the stool. You may have this test  every year starting at age 26. Flexible sigmoidoscopy or colonoscopy. You may have a sigmoidoscopy every 5 years or a colonoscopy every 10 years starting at age 74. Hepatitis C blood test. Hepatitis B blood test. Sexually transmitted disease (STD) testing. Diabetes screening. This is done by checking your blood sugar (glucose) after you have not eaten for a while (fasting). You may have this done every 1-3 years. Bone density scan. This is done to screen for osteoporosis. You may have this done starting at age 5. Mammogram. This may be done every 1-2 years. Talk to your health care provider about how often you should have regular mammograms. Talk with your health care provider about your test results, treatment options, and if necessary, the need for more tests. Vaccines  Your health care provider may recommend certain vaccines, such as: Influenza vaccine. This is recommended every year. Tetanus, diphtheria, and acellular pertussis (Tdap, Td) vaccine. You may need a Td booster every 10 years. Zoster vaccine. You may need this after age 60. Pneumococcal 13-valent conjugate (PCV13) vaccine. One dose is recommended after age 84. Pneumococcal polysaccharide (PPSV23) vaccine. One dose is recommended after age 79. Talk to your health care provider about which screenings and vaccines you need and how often you need them. This information is not intended to replace advice given to you by your health care provider. Make sure you discuss any questions you have with your health care provider. Document Released: 06/18/2015 Document Revised: 02/09/2016 Document Reviewed: 03/23/2015 Elsevier Interactive Patient Education  2017 [employer].  Fall Prevention in the Home Falls can cause injuries. They can happen to people of all ages. There are many things you can do to make your home safe and to help prevent falls. What can I do on the outside of my home? Regularly fix the edges of walkways and driveways  and fix any cracks. Remove anything that might make you trip as you walk through a door, such as a raised step or threshold. Trim any bushes or trees on the path to your home. Use bright outdoor lighting. Clear any walking paths of anything that might make someone trip, such as rocks or tools. Regularly check to see if handrails are loose or broken. Make sure that both sides of any steps have handrails. Any raised decks and porches should have guardrails on the edges. Have any leaves, snow, or ice cleared regularly. Use sand or salt on walking paths during winter. Clean up any spills in your garage right away. This includes oil or grease spills. What can I do in the bathroom? Use night lights. Install grab bars by the toilet and in the tub and shower. Do not use towel bars as grab bars. Use non-skid mats or decals in the tub or shower. If you need to sit down in the shower, use a plastic, non-slip stool. Keep the floor dry. Clean up any water that spills on the floor as soon as it happens. Remove soap buildup in the tub or shower regularly.  Attach bath mats securely with double-sided non-slip rug tape. Do not have throw rugs and other things on the floor that can make you trip. What can I do in the bedroom? Use night lights. Make sure that you have a light by your bed that is easy to reach. Do not use any sheets or blankets that are too big for your bed. They should not hang down onto the floor. Have a firm chair that has side arms. You can use this for support while you get dressed. Do not have throw rugs and other things on the floor that can make you trip. What can I do in the kitchen? Clean up any spills right away. Avoid walking on wet floors. Keep items that you use a lot in easy-to-reach places. If you need to reach something above you, use a strong step stool that has a grab bar. Keep electrical cords out of the way. Do not use floor polish or wax that makes floors slippery. If  you must use wax, use non-skid floor wax. Do not have throw rugs and other things on the floor that can make you trip. What can I do with my stairs? Do not leave any items on the stairs. Make sure that there are handrails on both sides of the stairs and use them. Fix handrails that are broken or loose. Make sure that handrails are as long as the stairways. Check any carpeting to make sure that it is firmly attached to the stairs. Fix any carpet that is loose or worn. Avoid having throw rugs at the top or bottom of the stairs. If you do have throw rugs, attach them to the floor with carpet tape. Make sure that you have a light switch at the top of the stairs and the bottom of the stairs. If you do not have them, ask someone to add them for you. What else can I do to help prevent falls? Wear shoes that: Do not have high heels. Have rubber bottoms. Are comfortable and fit you well. Are closed at the toe. Do not wear sandals. If you use a stepladder: Make sure that it is fully opened. Do not climb a closed stepladder. Make sure that both sides of the stepladder are locked into place. Ask someone to hold it for you, if possible. Clearly mark and make sure that you can see: Any grab bars or handrails. First and last steps. Where the edge of each step is. Use tools that help you move around (mobility aids) if they are needed. These include: Canes. Walkers. Scooters. Crutches. Turn on the lights when you go into a dark area. Replace any light bulbs as soon as they burn out. Set up your furniture so you have a clear path. Avoid moving your furniture around. If any of your floors are uneven, fix them. If there are any pets around you, be aware of where they are. Review your medicines with your doctor. Some medicines can make you feel dizzy. This can increase your chance of falling. Ask your doctor what other things that you can do to help prevent falls. This information is not intended to  replace advice given to you by your health care provider. Make sure you discuss any questions you have with your health care provider. Document Released: 03/18/2009 Document Revised: 10/28/2015 Document Reviewed: 06/26/2014 Elsevier Interactive Patient Education  2017 [employer].

## 2022-12-14 DIAGNOSIS — M810 Age-related osteoporosis without current pathological fracture: Secondary | ICD-10-CM | POA: Diagnosis not present

## 2023-01-01 DIAGNOSIS — M810 Age-related osteoporosis without current pathological fracture: Secondary | ICD-10-CM | POA: Diagnosis not present

## 2023-01-04 ENCOUNTER — Telehealth: Payer: Self-pay | Admitting: Internal Medicine

## 2023-01-04 NOTE — Telephone Encounter (Signed)
error 

## 2023-01-08 DIAGNOSIS — M810 Age-related osteoporosis without current pathological fracture: Secondary | ICD-10-CM | POA: Diagnosis not present

## 2023-01-11 ENCOUNTER — Ambulatory Visit (INDEPENDENT_AMBULATORY_CARE_PROVIDER_SITE_OTHER): Payer: Medicare HMO | Admitting: Internal Medicine

## 2023-01-11 ENCOUNTER — Encounter: Payer: Self-pay | Admitting: Internal Medicine

## 2023-01-11 VITALS — BP 110/70 | HR 63 | Temp 97.9°F | Resp 16 | Ht 65.0 in | Wt 124.0 lb

## 2023-01-11 DIAGNOSIS — N6459 Other signs and symptoms in breast: Secondary | ICD-10-CM

## 2023-01-11 DIAGNOSIS — M810 Age-related osteoporosis without current pathological fracture: Secondary | ICD-10-CM

## 2023-01-11 NOTE — Assessment & Plan Note (Signed)
Seeing endocrinology.  Prolia.

## 2023-01-11 NOTE — Progress Notes (Signed)
Subjective:    Patient ID: Sonya Hernandez, female    DOB: Feb 09, 1946, 77 y.o.   MRN: [id number]  Patient here for  Chief Complaint  Patient presents with   R Nipple Inversion    HPI Work in appt.  Work in with concerns regarding nipple inversion .  Last mammogram 02/14/22 - Birads I. Visit - 01/08/23 - Dr Tedd Sias. F/u Prolia. Reports that she noticed Mid July - right nipple inversion.  No pain.  No palpable nodule. No nipple discharge.  Otherwise doing well.  Stays actvie.  Exercising.     Past Medical History:  Diagnosis Date   Anemia    Cancer (HCC)    skin   Osteoporosis    Past Surgical History:  Procedure Laterality Date   NO PAST SURGERIES     Family History  Problem Relation Age of Onset   Diabetes Mother    Thyroid disease Mother        Questionable   Colon cancer Mother        Questionable   Heart attack Father    Deep vein thrombosis Father    Diabetes Sister    Breast cancer Neg Hx    Social History   Socioeconomic History   Marital status: Married    Spouse name: Not on file   Number of children: 3   Years of education: Not on file   Highest education level: Not on file  Occupational History   Not on file  Tobacco Use   Smoking status: Never   Smokeless tobacco: Never  Vaping Use   Vaping status: Never Used  Substance and Sexual Activity   Alcohol use: No    Alcohol/week: 0.0 standard drinks of alcohol   Drug use: No   Sexual activity: Yes  Other Topics Concern   Not on file  Social History Narrative   She is married and has 3 daughters.   Social Determinants of Health   Financial Resource Strain: Low Risk  (11/20/2022)   Overall Financial Resource Strain (CARDIA)    Difficulty of Paying Living Expenses: Not hard at all  Food Insecurity: No Food Insecurity (11/20/2022)   Hunger Vital Sign    Worried About Running Out of Food in the Last Year: Never true    Ran Out of Food in the Last Year: Never true  Transportation Needs: No  Transportation Needs (11/20/2022)   PRAPARE - Administrator, Civil Service (Medical): No    Lack of Transportation (Non-Medical): No  Physical Activity: Sufficiently Active (11/20/2022)   Exercise Vital Sign    Days of Exercise per Week: 7 days    Minutes of Exercise per Session: 30 min  Stress: No Stress Concern Present (11/20/2022)   [employer] of Occupational Health - Occupational Stress Questionnaire    Feeling of Stress : Not at all  Social Connections: Socially Integrated (11/20/2022)   Social Connection and Isolation Panel [NHANES]    Frequency of Communication with Friends and Family: More than three times a week    Frequency of Social Gatherings with Friends and Family: More than three times a week    Attends Religious Services: More than 4 times per year    Active Member of [employer] or Organizations: Yes    Attends Engineer, structural: More than 4 times per year    Marital Status: Married     Review of Systems  Constitutional:  Negative for appetite change and unexpected weight change.  HENT:  Negative for congestion and sinus pressure.   Respiratory:  Negative for cough, chest tightness and shortness of breath.   Cardiovascular:  Negative for chest pain, palpitations and leg swelling.  Gastrointestinal:  Negative for abdominal pain, diarrhea, nausea and vomiting.  Genitourinary:  Negative for difficulty urinating and dysuria.  Musculoskeletal:  Negative for joint swelling and myalgias.  Skin:  Negative for color change and rash.  Neurological:  Negative for dizziness, light-headedness and headaches.  Psychiatric/Behavioral:  Negative for agitation and dysphoric mood.        Objective:     BP 110/70   Pulse 63   Temp 97.9 F (36.6 C)   Resp 16   Ht 5\' 5"  (1.651 m)   Wt 124 lb (56.2 kg)   LMP 06/24/1993   SpO2 98%   BMI 20.63 kg/m  Wt Readings from Last 3 Encounters:  01/11/23 124 lb (56.2 kg)  11/20/22 130 lb (59 kg)  04/03/22 123  lb 6.4 oz (56 kg)    Physical Exam Vitals reviewed.  Constitutional:      General: She is not in acute distress.    Appearance: Normal appearance.  HENT:     Head: Normocephalic and atraumatic.     Right Ear: External ear normal.     Left Ear: External ear normal.  Eyes:     General: No scleral icterus.       Right eye: No discharge.        Left eye: No discharge.     Conjunctiva/sclera: Conjunctivae normal.  Neck:     Thyroid: No thyromegaly.  Cardiovascular:     Rate and Rhythm: Normal rate and regular rhythm.  Pulmonary:     Effort: No respiratory distress.     Breath sounds: Normal breath sounds. No wheezing.     Comments: Breasts: no nipple discharge present.  Right nipple inverted.  No palpable nodule or axillary adenopathy.  No pain.  Abdominal:     General: Bowel sounds are normal.     Palpations: Abdomen is soft.     Tenderness: There is no abdominal tenderness.  Musculoskeletal:        General: No swelling or tenderness.     Cervical back: Neck supple. No tenderness.  Lymphadenopathy:     Cervical: No cervical adenopathy.  Skin:    Findings: No erythema or rash.  Neurological:     Mental Status: She is alert.  Psychiatric:        Mood and Affect: Mood normal.        Behavior: Behavior normal.      Outpatient Encounter Medications as of 01/11/2023  Medication Sig   Calcium Carbonate-Vitamin D 600-400 MG-UNIT per chew tablet Chew 1 tablet by mouth 2 (two) times daily.    Cholecalciferol 1000 UNITS capsule Take 1,000 Units by mouth daily.   clobetasol cream (TEMOVATE) 0.05 % Apply 1 application topically 2 (two) times a week.   cyanocobalamin (VITAMIN B12) 1000 MCG tablet Take 1,000 mcg by mouth daily.   denosumab (PROLIA) 60 MG/ML SOLN injection Inject 60 mg into the skin every 6 (six) months. Administer in upper arm, thigh, or abdomen   estradiol (ESTRACE) 0.1 MG/GM vaginal cream Place 2 g vaginally 3 (three) times a week.    ferrous sulfate 325 (65 FE) MG  EC tablet Take 325 mg by mouth every other day.    pyridOXINE (VITAMIN B6) 25 MG tablet Take 25 mg by mouth daily.   rosuvastatin (CRESTOR) 10  MG tablet Take 1 tablet (10 mg total) by mouth daily.   No facility-administered encounter medications on file as of 01/11/2023.     Lab Results  Component Value Date   WBC 5.2 04/03/2022   HGB 12.8 04/03/2022   HCT 38.6 04/03/2022   PLT 205.0 04/03/2022   GLUCOSE 88 04/03/2022   CHOL 160 09/18/2022   TRIG 79.0 09/18/2022   HDL 58.90 09/18/2022   LDLDIRECT 157.7 06/24/2012   LDLCALC 85 09/18/2022   ALT 19 09/18/2022   AST 22 09/18/2022   NA 139 04/03/2022   K 4.3 04/03/2022   CL 104 04/03/2022   CREATININE 0.74 04/03/2022   BUN 18 04/03/2022   CO2 31 04/03/2022   TSH 3.67 04/03/2022    MM 3D SCREEN BREAST BILATERAL  Result Date: 02/15/2022 CLINICAL DATA:  Screening. EXAM: DIGITAL SCREENING BILATERAL MAMMOGRAM WITH TOMOSYNTHESIS AND CAD TECHNIQUE: Bilateral screening digital craniocaudal and mediolateral oblique mammograms were obtained. Bilateral screening digital breast tomosynthesis was performed. The images were evaluated with computer-aided detection. COMPARISON:  Previous exam(s). ACR Breast Density Category b: There are scattered areas of fibroglandular density. FINDINGS: There are no findings suspicious for malignancy. IMPRESSION: No mammographic evidence of malignancy. A result letter of this screening mammogram will be mailed directly to the patient. RECOMMENDATION: Screening mammogram in one year. (Code:SM-B-01Y) BI-RADS CATEGORY  1: Negative. Electronically Signed   By: Edwin Cap M.D.   On: 02/15/2022 11:24       Assessment & Plan:  Inversion of nipple Assessment & Plan: Noticed nipple inversion as outlined.  New.  Exam as outlined.  Will schedule for diagnostic mammogram and possible ultrasound.  Further w/up pending results.   Orders: -     MM 3D DIAGNOSTIC MAMMOGRAM BILATERAL BREAST; Future -     [place] LIMITED  ULTRASOUND INCLUDING AXILLA RIGHT BREAST; Future  Osteoporosis without current pathological fracture, unspecified osteoporosis type Assessment & Plan: Seeing endocrinology. Prolia.       [place], MD

## 2023-01-11 NOTE — Assessment & Plan Note (Signed)
Noticed nipple inversion as outlined.  New.  Exam as outlined.  Will schedule for diagnostic mammogram and possible ultrasound.  Further w/up pending results.

## 2023-01-16 ENCOUNTER — Ambulatory Visit
Admission: RE | Admit: 2023-01-16 | Discharge: 2023-01-16 | Disposition: A | Discharge status: Home / Self Care | Payer: Medicare HMO | Source: Ambulatory Visit | Attending: Internal Medicine | Admitting: Internal Medicine

## 2023-01-16 DIAGNOSIS — N6459 Other signs and symptoms in breast: Secondary | ICD-10-CM | POA: Insufficient documentation

## 2023-01-16 DIAGNOSIS — R92323 Mammographic fibroglandular density, bilateral breasts: Secondary | ICD-10-CM | POA: Diagnosis not present

## 2023-01-17 ENCOUNTER — Telehealth: Payer: Self-pay

## 2023-01-17 NOTE — Telephone Encounter (Signed)
-----   Message from [place] sent at 01/17/2023  5:49 AM EDT ----- Please call and notify - I reviewed mammogram and ultrasound report and both are ok.  Radiology recommended to continue yearly screening mammogram. Radiology felt nipple inversion was normal variant.  If persistent, I can refer her to surgery to confirm they agree nothing more needs to be done.

## 2023-01-17 NOTE — Telephone Encounter (Signed)
Pt called back and I read the message to her and she verbalized understanding 

## 2023-01-17 NOTE — Telephone Encounter (Signed)
Noted  

## 2023-02-12 ENCOUNTER — Other Ambulatory Visit: Payer: Self-pay | Admitting: Internal Medicine

## 2023-04-06 DIAGNOSIS — L570 Actinic keratosis: Secondary | ICD-10-CM | POA: Diagnosis not present

## 2023-04-06 DIAGNOSIS — D2262 Melanocytic nevi of left upper limb, including shoulder: Secondary | ICD-10-CM | POA: Diagnosis not present

## 2023-04-06 DIAGNOSIS — Z85828 Personal history of other malignant neoplasm of skin: Secondary | ICD-10-CM | POA: Diagnosis not present

## 2023-04-06 DIAGNOSIS — D2272 Melanocytic nevi of left lower limb, including hip: Secondary | ICD-10-CM | POA: Diagnosis not present

## 2023-04-06 DIAGNOSIS — D2261 Melanocytic nevi of right upper limb, including shoulder: Secondary | ICD-10-CM | POA: Diagnosis not present

## 2023-04-06 DIAGNOSIS — X32XXXA Exposure to sunlight, initial encounter: Secondary | ICD-10-CM | POA: Diagnosis not present

## 2023-04-06 DIAGNOSIS — D225 Melanocytic nevi of trunk: Secondary | ICD-10-CM | POA: Diagnosis not present

## 2023-04-06 DIAGNOSIS — D485 Neoplasm of uncertain behavior of skin: Secondary | ICD-10-CM | POA: Diagnosis not present

## 2023-04-06 DIAGNOSIS — C44519 Basal cell carcinoma of skin of other part of trunk: Secondary | ICD-10-CM | POA: Diagnosis not present

## 2023-04-09 ENCOUNTER — Encounter: Payer: Self-pay | Admitting: Internal Medicine

## 2023-04-09 ENCOUNTER — Ambulatory Visit: Payer: Medicare HMO | Admitting: Internal Medicine

## 2023-04-09 VITALS — BP 120/70 | HR 63 | Temp 98.0°F | Resp 16 | Ht 65.0 in | Wt 125.8 lb

## 2023-04-09 DIAGNOSIS — N6459 Other signs and symptoms in breast: Secondary | ICD-10-CM

## 2023-04-09 DIAGNOSIS — Z0000 Encounter for general adult medical examination without abnormal findings: Secondary | ICD-10-CM

## 2023-04-09 DIAGNOSIS — Z131 Encounter for screening for diabetes mellitus: Secondary | ICD-10-CM | POA: Diagnosis not present

## 2023-04-09 DIAGNOSIS — E538 Deficiency of other specified B group vitamins: Secondary | ICD-10-CM | POA: Insufficient documentation

## 2023-04-09 DIAGNOSIS — R200 Anesthesia of skin: Secondary | ICD-10-CM

## 2023-04-09 DIAGNOSIS — R739 Hyperglycemia, unspecified: Secondary | ICD-10-CM

## 2023-04-09 DIAGNOSIS — E7800 Pure hypercholesterolemia, unspecified: Secondary | ICD-10-CM

## 2023-04-09 DIAGNOSIS — M810 Age-related osteoporosis without current pathological fracture: Secondary | ICD-10-CM | POA: Diagnosis not present

## 2023-04-09 LAB — HEPATIC FUNCTION PANEL
ALT: 23 U/L (ref 0–35)
AST: 26 U/L (ref 0–37)
Albumin: 3.9 g/dL (ref 3.5–5.2)
Alkaline Phosphatase: 50 U/L (ref 39–117)
Bilirubin, Direct: 0.1 mg/dL (ref 0.0–0.3)
Total Bilirubin: 0.6 mg/dL (ref 0.2–1.2)
Total Protein: 6 g/dL (ref 6.0–8.3)

## 2023-04-09 LAB — BASIC METABOLIC PANEL
BUN: 12 mg/dL (ref 6–23)
CO2: 31 meq/L (ref 19–32)
Calcium: 9.3 mg/dL (ref 8.4–10.5)
Chloride: 106 meq/L (ref 96–112)
Creatinine, Ser: 0.76 mg/dL (ref 0.40–1.20)
GFR: 74.57 mL/min (ref >60.00)
Glucose, Bld: 89 mg/dL (ref 70–99)
Potassium: 4.2 meq/L (ref 3.5–5.1)
Sodium: 141 meq/L (ref 135–145)

## 2023-04-09 LAB — CBC WITH DIFFERENTIAL/PLATELET
Basophils Absolute: 0 10*3/uL (ref 0.0–0.1)
Basophils Relative: 0.8 % (ref 0.0–3.0)
Eosinophils Absolute: 0.2 10*3/uL (ref 0.0–0.7)
Eosinophils Relative: 4.4 % (ref 0.0–5.0)
HCT: 40.7 % (ref 36.0–46.0)
Hemoglobin: 13.2 g/dL (ref 12.0–15.0)
Lymphocytes Relative: 47.1 % — ABNORMAL HIGH (ref 12.0–46.0)
Lymphs Abs: 2.2 10*3/uL (ref 0.7–4.0)
MCHC: 32.5 g/dL (ref 30.0–36.0)
MCV: 94.9 fL (ref 78.0–100.0)
Monocytes Absolute: 0.4 10*3/uL (ref 0.1–1.0)
Monocytes Relative: 8.3 % (ref 3.0–12.0)
Neutro Abs: 1.8 10*3/uL (ref 1.4–7.7)
Neutrophils Relative %: 39.4 % — ABNORMAL LOW (ref 43.0–77.0)
Platelets: 200 10*3/uL (ref 150.0–400.0)
RBC: 4.29 Mil/uL (ref 3.87–5.11)
RDW: 13.4 % (ref 11.5–15.5)
WBC: 4.6 10*3/uL (ref 4.0–10.5)

## 2023-04-09 LAB — VITAMIN B12: Vitamin B-12: >1537 pg/mL — ABNORMAL HIGH (ref 211–911)

## 2023-04-09 LAB — LIPID PANEL
Cholesterol: 150 mg/dL (ref 0–200)
HDL: 54.2 mg/dL (ref >39.00)
LDL Cholesterol: 82 mg/dL (ref 0–99)
NonHDL: 95.64
Total CHOL/HDL Ratio: 3
Triglycerides: 68 mg/dL (ref 0.0–149.0)
VLDL: 13.6 mg/dL (ref 0.0–40.0)

## 2023-04-09 LAB — TSH: TSH: 4.26 u[IU]/mL (ref 0.35–5.50)

## 2023-04-09 LAB — HEMOGLOBIN A1C: Hgb A1c MFr Bld: 5.9 % (ref 4.6–6.5)

## 2023-04-09 LAB — VITAMIN D 25 HYDROXY (VIT D DEFICIENCY, FRACTURES): VITD: 34.41 ng/mL (ref 30.00–100.00)

## 2023-04-09 NOTE — Assessment & Plan Note (Signed)
Had diagnostic mammogram in 01/2023.  Ok.  Recommended continued screening in one year.  Discussed with her today.  Hold on surgery referral.  Continue to follow with mammogram as recommended.

## 2023-04-09 NOTE — Progress Notes (Signed)
Subjective:    Patient ID: Sonya Hernandez, female    DOB: 1945/12/11, 77 y.o.   MRN: [id number]  Patient here for  Chief Complaint  Patient presents with   Annual Exam    HPI Here for a physical exam. Seeing Dr Tedd Sias.  Receiving prolia.  Last bone density 12/2022 - INTERPRETATION: Osteoporosis. No significant change in bone mineral density from 10-26-2020. Lumbar spine not reported due to scoliosis or sclerosis.  Due next prolia 06/2023.  Also had NCS 08/22/22 - generalized polyneuropathy with superimposed left CTS. She is wearing her splint.  Helping.  Does not feel needs any further intervention.  Stays active. No chest pain or sob reported. No cough or congestion reported.  No abdominal pain or bowel change reported.    Past Medical History:  Diagnosis Date   Anemia    Cancer (HCC)    skin   Osteoporosis    Past Surgical History:  Procedure Laterality Date   NO PAST SURGERIES     Family History  Problem Relation Age of Onset   Diabetes Mother    Thyroid disease Mother        Questionable   Colon cancer Mother        Questionable   Heart attack Father    Deep vein thrombosis Father    Diabetes Sister    Breast cancer Daughter    Social History   Socioeconomic History   Marital status: Married    Spouse name: Not on file   Number of children: 3   Years of education: Not on file   Highest education level: Not on file  Occupational History   Not on file  Tobacco Use   Smoking status: Never   Smokeless tobacco: Never  Vaping Use   Vaping status: Never Used  Substance and Sexual Activity   Alcohol use: No    Alcohol/week: 0.0 standard drinks of alcohol   Drug use: No   Sexual activity: Yes  Other Topics Concern   Not on file  Social History Narrative   She is married and has 3 daughters.   Social Determinants of Health   Financial Resource Strain: Low Risk  (11/20/2022)   Overall Financial Resource Strain (CARDIA)    Difficulty of Paying Living Expenses:  Not hard at all  Food Insecurity: No Food Insecurity (11/20/2022)   Hunger Vital Sign    Worried About Running Out of Food in the Last Year: Never true    Ran Out of Food in the Last Year: Never true  Transportation Needs: No Transportation Needs (11/20/2022)   PRAPARE - Administrator, Civil Service (Medical): No    Lack of Transportation (Non-Medical): No  Physical Activity: Sufficiently Active (11/20/2022)   Exercise Vital Sign    Days of Exercise per Week: 7 days    Minutes of Exercise per Session: 30 min  Stress: No Stress Concern Present (11/20/2022)   [employer] of Occupational Health - Occupational Stress Questionnaire    Feeling of Stress : Not at all  Social Connections: Socially Integrated (11/20/2022)   Social Connection and Isolation Panel [NHANES]    Frequency of Communication with Friends and Family: More than three times a week    Frequency of Social Gatherings with Friends and Family: More than three times a week    Attends Religious Services: More than 4 times per year    Active Member of [employer] or Organizations: Yes    Attends Banker  Meetings: More than 4 times per year    Marital Status: Married     Review of Systems  Constitutional:  Negative for appetite change and unexpected weight change.  HENT:  Negative for congestion, sinus pressure and sore throat.   Eyes:  Negative for pain and visual disturbance.  Respiratory:  Negative for cough, chest tightness and shortness of breath.   Cardiovascular:  Negative for chest pain, palpitations and leg swelling.  Gastrointestinal:  Negative for abdominal pain, diarrhea, nausea and vomiting.  Genitourinary:  Negative for difficulty urinating and dysuria.  Musculoskeletal:  Negative for joint swelling and myalgias.  Skin:  Negative for color change and rash.  Neurological:  Negative for dizziness and headaches.  Hematological:  Negative for adenopathy. Does not bruise/bleed easily.   Psychiatric/Behavioral:  Negative for agitation and dysphoric mood.        Objective:     BP 120/70   Pulse 63   Temp 98 F (36.7 C)   Resp 16   Ht 5\' 5"  (1.651 m)   Wt 125 lb 12.8 oz (57.1 kg)   LMP 06/24/1993   SpO2 98%   BMI 20.93 kg/m  Wt Readings from Last 3 Encounters:  04/09/23 125 lb 12.8 oz (57.1 kg)  01/11/23 124 lb (56.2 kg)  11/20/22 130 lb (59 kg)    Physical Exam Vitals reviewed.  Constitutional:      General: She is not in acute distress.    Appearance: Normal appearance.  HENT:     Head: Normocephalic and atraumatic.     Right Ear: External ear normal.     Left Ear: External ear normal.  Eyes:     General: No scleral icterus.       Right eye: No discharge.        Left eye: No discharge.     Conjunctiva/sclera: Conjunctivae normal.  Neck:     Thyroid: No thyromegaly.  Cardiovascular:     Rate and Rhythm: Normal rate and regular rhythm.  Pulmonary:     Effort: No respiratory distress.     Breath sounds: Normal breath sounds. No wheezing.     Comments: Breasts - no nipple discharge. Right breast - nipple inverted.  No palpable nodules or axillary adenopathy (Bilateral breasts).   Abdominal:     General: Bowel sounds are normal.     Palpations: Abdomen is soft.     Tenderness: There is no abdominal tenderness.  Musculoskeletal:        General: No swelling or tenderness.     Cervical back: Neck supple. No tenderness.  Lymphadenopathy:     Cervical: No cervical adenopathy.  Skin:    Findings: No erythema or rash.  Neurological:     Mental Status: She is alert.  Psychiatric:        Mood and Affect: Mood normal.        Behavior: Behavior normal.      Outpatient Encounter Medications as of 04/09/2023  Medication Sig   Calcium Carbonate-Vitamin D 600-400 MG-UNIT per chew tablet Chew 1 tablet by mouth 2 (two) times daily.    Cholecalciferol 1000 UNITS capsule Take 1,000 Units by mouth daily.   clobetasol cream (TEMOVATE) 0.05 % Apply 1  application topically 2 (two) times a week.   cyanocobalamin (VITAMIN B12) 1000 MCG tablet Take 1,000 mcg by mouth daily.   denosumab (PROLIA) 60 MG/ML SOLN injection Inject 60 mg into the skin every 6 (six) months. Administer in upper arm, thigh, or abdomen  estradiol (ESTRACE) 0.1 MG/GM vaginal cream Place 2 g vaginally 3 (three) times a week.    ferrous sulfate 325 (65 FE) MG EC tablet Take 325 mg by mouth every other day.    pyridOXINE (VITAMIN B6) 25 MG tablet Take 25 mg by mouth daily.   rosuvastatin (CRESTOR) 10 MG tablet Take 1 tablet (10 mg total) by mouth daily.   No facility-administered encounter medications on file as of 04/09/2023.     Lab Results  Component Value Date   WBC 5.2 04/03/2022   HGB 12.8 04/03/2022   HCT 38.6 04/03/2022   PLT 205.0 04/03/2022   GLUCOSE 88 04/03/2022   CHOL 160 09/18/2022   TRIG 79.0 09/18/2022   HDL 58.90 09/18/2022   LDLDIRECT 157.7 06/24/2012   LDLCALC 85 09/18/2022   ALT 19 09/18/2022   AST 22 09/18/2022   NA 139 04/03/2022   K 4.3 04/03/2022   CL 104 04/03/2022   CREATININE 0.74 04/03/2022   BUN 18 04/03/2022   CO2 31 04/03/2022   TSH 3.67 04/03/2022    MM 3D DIAGNOSTIC MAMMOGRAM BILATERAL BREAST  Result Date: 01/16/2023 CLINICAL DATA:  Patient presents for bilateral diagnostic examination due to 6 week history of right nipple inversion. No focal palpable abnormality. No nipple discharge. EXAM: DIGITAL DIAGNOSTIC BILATERAL MAMMOGRAM WITH TOMOSYNTHESIS AND CAD; ULTRASOUND RIGHT BREAST LIMITED TECHNIQUE: Bilateral digital diagnostic mammography and breast tomosynthesis was performed. The images were evaluated with computer-aided detection. ; Targeted ultrasound examination of the right breast was performed COMPARISON:  Previous exam(s). ACR Breast Density Category b: There are scattered areas of fibroglandular density. FINDINGS: Examination demonstrates no focal abnormality in the right retroareolar region. Right nipple areolar complex  is unchanged from 2020. Remainder of the right breast is unchanged. Left breast is unchanged. Targeted ultrasound is performed, showing no focal abnormality in the right retroareolar region. IMPRESSION: Stable mammogram. No focal abnormality involving the right nipple areolar complex or retroareolar region. Patient's nipple inversion is likely a normal variant. RECOMMENDATION: Recommend continued annual bilateral screening mammographic follow-up. I have discussed the findings and recommendations with the patient. If applicable, a reminder letter will be sent to the patient regarding the next appointment. BI-RADS CATEGORY  1: Negative. Electronically Signed   By: Elberta Fortis M.D.   On: 01/16/2023 10:41   [place] LIMITED ULTRASOUND INCLUDING AXILLA RIGHT BREAST  Result Date: 01/16/2023 CLINICAL DATA:  Patient presents for bilateral diagnostic examination due to 6 week history of right nipple inversion. No focal palpable abnormality. No nipple discharge. EXAM: DIGITAL DIAGNOSTIC BILATERAL MAMMOGRAM WITH TOMOSYNTHESIS AND CAD; ULTRASOUND RIGHT BREAST LIMITED TECHNIQUE: Bilateral digital diagnostic mammography and breast tomosynthesis was performed. The images were evaluated with computer-aided detection. ; Targeted ultrasound examination of the right breast was performed COMPARISON:  Previous exam(s). ACR Breast Density Category b: There are scattered areas of fibroglandular density. FINDINGS: Examination demonstrates no focal abnormality in the right retroareolar region. Right nipple areolar complex is unchanged from 2020. Remainder of the right breast is unchanged. Left breast is unchanged. Targeted ultrasound is performed, showing no focal abnormality in the right retroareolar region. IMPRESSION: Stable mammogram. No focal abnormality involving the right nipple areolar complex or retroareolar region. Patient's nipple inversion is likely a normal variant. RECOMMENDATION: Recommend continued annual bilateral screening  mammographic follow-up. I have discussed the findings and recommendations with the patient. If applicable, a reminder letter will be sent to the patient regarding the next appointment. BI-RADS CATEGORY  1: Negative. Electronically Signed   By: Reuel Boom  Micheline Maze M.D.   On: 01/16/2023 10:41       Assessment & Plan:  Routine general medical examination at a health care facility  Health care maintenance Assessment & Plan: Physical today 04/09/23.  Mammogram 01/16/23 - Birads I.  Colonoscopy 11/2019 (Dr Norma Fredrickson).  Receiving prolia.  Bone density 01/01/23 - INTERPRETATION: Osteoporosis. No significant change in bone mineral density from 10-26-2020. Lumbar spine not reported due to scoliosis or sclerosis. Next injection due 06/2023.   Orders: -     Hemoglobin A1c  Hypercholesteremia Assessment & Plan: On crestor.  Follow lipid panel and liver function tests.    Orders: -     CBC with Differential/Platelet -     Basic metabolic panel -     Hepatic function panel -     TSH -     Lipid panel  Osteoporosis without current pathological fracture, unspecified osteoporosis type Assessment & Plan:  Seeing Dr Tedd Sias.  Receiving prolia.  Last bone density 12/2022 - INTERPRETATION: Osteoporosis. No significant change in bone mineral density from 10-26-2020. Lumbar spine not reported due to scoliosis or sclerosis.  Due next prolia 06/2023. Check vitamin D level today.   Orders: -     VITAMIN D 25 Hydroxy (Vit-D Deficiency, Fractures)  B12 deficiency Assessment & Plan: Taking oral supplements.  Check B12 level with labs today.   Orders: -     Vitamin B12  Hyperglycemia Assessment & Plan: Check met b and A1c.    Inversion of nipple Assessment & Plan: Had diagnostic mammogram in 01/2023.  Ok.  Recommended continued screening in one year.  Discussed with her today.  Hold on surgery referral.  Continue to follow with mammogram as recommended.    Hand numbness Assessment & Plan:   Also had NCS 08/22/22 -  generalized polyneuropathy with superimposed left CTS. She is wearing her splint.  Helping.  Does not feel needs any further intervention.        [place], MD

## 2023-04-09 NOTE — Assessment & Plan Note (Signed)
Taking oral supplements.  Check B12 level with labs today.

## 2023-04-09 NOTE — Assessment & Plan Note (Signed)
Physical today 04/09/23.  Mammogram 01/16/23 - Birads I.  Colonoscopy 11/2019 (Dr Norma Fredrickson).  Receiving prolia.  Bone density 01/01/23 - INTERPRETATION: Osteoporosis. No significant change in bone mineral density from 10-26-2020. Lumbar spine not reported due to scoliosis or sclerosis. Next injection due 06/2023.

## 2023-04-09 NOTE — Assessment & Plan Note (Signed)
Check met b and A1c.

## 2023-04-09 NOTE — Assessment & Plan Note (Signed)
On crestor.  Follow lipid panel and liver function tests.   

## 2023-04-09 NOTE — Assessment & Plan Note (Signed)
Seeing Dr Tedd Sias.  Receiving prolia.  Last bone density 12/2022 - INTERPRETATION: Osteoporosis. No significant change in bone mineral density from 10-26-2020. Lumbar spine not reported due to scoliosis or sclerosis.  Due next prolia 06/2023. Check vitamin D level today.

## 2023-04-09 NOTE — Assessment & Plan Note (Signed)
Also had NCS 08/22/22 - generalized polyneuropathy with superimposed left CTS. She is wearing her splint.  Helping.  Does not feel needs any further intervention.

## 2023-05-25 DIAGNOSIS — L905 Scar conditions and fibrosis of skin: Secondary | ICD-10-CM | POA: Diagnosis not present

## 2023-05-25 DIAGNOSIS — C44519 Basal cell carcinoma of skin of other part of trunk: Secondary | ICD-10-CM | POA: Diagnosis not present

## 2023-06-01 ENCOUNTER — Other Ambulatory Visit: Payer: Self-pay | Admitting: Internal Medicine

## 2023-06-22 DIAGNOSIS — M810 Age-related osteoporosis without current pathological fracture: Secondary | ICD-10-CM | POA: Diagnosis not present

## 2023-06-25 DIAGNOSIS — H33302 Unspecified retinal break, left eye: Secondary | ICD-10-CM | POA: Diagnosis not present

## 2023-06-25 DIAGNOSIS — M810 Age-related osteoporosis without current pathological fracture: Secondary | ICD-10-CM | POA: Diagnosis not present

## 2023-06-25 DIAGNOSIS — Z961 Presence of intraocular lens: Secondary | ICD-10-CM | POA: Diagnosis not present

## 2023-06-25 DIAGNOSIS — H26493 Other secondary cataract, bilateral: Secondary | ICD-10-CM | POA: Diagnosis not present

## 2023-08-10 DIAGNOSIS — H903 Sensorineural hearing loss, bilateral: Secondary | ICD-10-CM | POA: Diagnosis not present

## 2023-08-10 DIAGNOSIS — H6123 Impacted cerumen, bilateral: Secondary | ICD-10-CM | POA: Diagnosis not present

## 2023-09-10 ENCOUNTER — Other Ambulatory Visit: Payer: Self-pay | Admitting: Internal Medicine

## 2023-10-08 ENCOUNTER — Ambulatory Visit (INDEPENDENT_AMBULATORY_CARE_PROVIDER_SITE_OTHER): Payer: Medicare HMO | Admitting: Internal Medicine

## 2023-10-08 ENCOUNTER — Encounter: Payer: Self-pay | Admitting: Internal Medicine

## 2023-10-08 VITALS — BP 128/70 | HR 67 | Temp 98.0°F | Resp 16 | Ht 65.5 in | Wt 130.0 lb

## 2023-10-08 DIAGNOSIS — E7800 Pure hypercholesterolemia, unspecified: Secondary | ICD-10-CM

## 2023-10-08 DIAGNOSIS — E538 Deficiency of other specified B group vitamins: Secondary | ICD-10-CM | POA: Diagnosis not present

## 2023-10-08 DIAGNOSIS — Z0000 Encounter for general adult medical examination without abnormal findings: Secondary | ICD-10-CM

## 2023-10-08 DIAGNOSIS — Z1231 Encounter for screening mammogram for malignant neoplasm of breast: Secondary | ICD-10-CM | POA: Diagnosis not present

## 2023-10-08 DIAGNOSIS — R739 Hyperglycemia, unspecified: Secondary | ICD-10-CM

## 2023-10-08 DIAGNOSIS — E531 Pyridoxine deficiency: Secondary | ICD-10-CM | POA: Insufficient documentation

## 2023-10-08 LAB — BASIC METABOLIC PANEL WITH GFR
BUN: 21 mg/dL (ref 6–23)
CO2: 29 meq/L (ref 19–32)
Calcium: 9.8 mg/dL (ref 8.4–10.5)
Chloride: 106 meq/L (ref 96–112)
Creatinine, Ser: 0.82 mg/dL (ref 0.40–1.20)
GFR: 67.83 mL/min (ref >60.00)
Glucose, Bld: 94 mg/dL (ref 70–99)
Potassium: 4 meq/L (ref 3.5–5.1)
Sodium: 141 meq/L (ref 135–145)

## 2023-10-08 LAB — HEPATIC FUNCTION PANEL
ALT: 22 U/L (ref 0–35)
AST: 24 U/L (ref 0–37)
Albumin: 4.1 g/dL (ref 3.5–5.2)
Alkaline Phosphatase: 46 U/L (ref 39–117)
Bilirubin, Direct: 0.1 mg/dL (ref 0.0–0.3)
Total Bilirubin: 0.6 mg/dL (ref 0.2–1.2)
Total Protein: 6.5 g/dL (ref 6.0–8.3)

## 2023-10-08 LAB — LIPID PANEL
Cholesterol: 160 mg/dL (ref 0–200)
HDL: 50.9 mg/dL (ref >39.00)
LDL Cholesterol: 90 mg/dL (ref 0–99)
NonHDL: 109.13
Total CHOL/HDL Ratio: 3
Triglycerides: 97 mg/dL (ref 0.0–149.0)
VLDL: 19.4 mg/dL (ref 0.0–40.0)

## 2023-10-08 LAB — VITAMIN B12: Vitamin B-12: >1537 pg/mL — ABNORMAL HIGH (ref 211–911)

## 2023-10-08 LAB — HEMOGLOBIN A1C: Hgb A1c MFr Bld: 6 % (ref 4.6–6.5)

## 2023-10-08 MED ORDER — ROSUVASTATIN CALCIUM 10 MG PO TABS: 10.0000 mg | ORAL_TABLET | Freq: Every day | ORAL | 2 refills | Status: DC

## 2023-10-08 NOTE — Assessment & Plan Note (Signed)
 Check met b and A1c with today's labs.

## 2023-10-08 NOTE — Assessment & Plan Note (Signed)
Continue crestor.  Low cholesterol diet and exercise.  Follow lipid panel.  

## 2023-10-08 NOTE — Assessment & Plan Note (Signed)
 Taking oral supplements. Recheck B12 level with labs today.

## 2023-10-08 NOTE — Assessment & Plan Note (Signed)
 On oral supplements. Check B6 level today

## 2023-10-08 NOTE — Progress Notes (Signed)
 Subjective:    Patient ID: Sonya Hernandez, female    DOB: 23-Apr-1946, 78 y.o.   MRN: [id number]  Patient here for  Chief Complaint  Patient presents with   Medical Management of Chronic Issues    HPI Here for a scheduled follow up - follow up regarding hypercholesterolemia and osteoporosis. . Seeing Dr Lorelei Rogers.  Receiving prolia .  Last 06/22/23. Last bone density 12/2022 - INTERPRETATION: Osteoporosis. No significant change in bone mineral density from 10-26-2020.  Lumbar spine not reported due to scoliosis or sclerosis. Had a f/u with Dr Lorelei Rogers 06/25/23. Due to cost, plan to start reclast 12/2023. Also had NCS 08/22/22 - generalized polyneuropathy with superimposed left CTS.  Neurology started her on B12 and B6 supplements. Recently got hearing aids. Doing well with these. Stays active. No chest pain or sob reported. No abdominal pain or bowel change reported.    Past Medical History:  Diagnosis Date   Anemia    Cancer (HCC)    skin   Osteoporosis    Past Surgical History:  Procedure Laterality Date   NO PAST SURGERIES     Family History  Problem Relation Age of Onset   Diabetes Mother    Thyroid  disease Mother        Questionable   Colon cancer Mother        Questionable   Heart attack Father    Deep vein thrombosis Father    Diabetes Sister    Breast cancer Daughter    Social History   Socioeconomic History   Marital status: Married    Spouse name: Not on file   Number of children: 3   Years of education: Not on file   Highest education level: Not on file  Occupational History   Not on file  Tobacco Use   Smoking status: Never   Smokeless tobacco: Never  Vaping Use   Vaping status: Never Used  Substance and Sexual Activity   Alcohol use: No    Alcohol/week: 0.0 standard drinks of alcohol   Drug use: No   Sexual activity: Yes  Other Topics Concern   Not on file  Social History Narrative   She is married and has 3 daughters.   Social Drivers of Manufacturing engineer Strain: Low Risk  (11/20/2022)   Overall Financial Resource Strain (CARDIA)    Difficulty of Paying Living Expenses: Not hard at all  Food Insecurity: No Food Insecurity (11/20/2022)   Hunger Vital Sign    Worried About Running Out of Food in the Last Year: Never true    Ran Out of Food in the Last Year: Never true  Transportation Needs: No Transportation Needs (11/20/2022)   PRAPARE - Administrator, Civil Service (Medical): No    Lack of Transportation (Non-Medical): No  Physical Activity: Sufficiently Active (11/20/2022)   Exercise Vital Sign    Days of Exercise per Week: 7 days    Minutes of Exercise per Session: 30 min  Stress: No Stress Concern Present (11/20/2022)   [employer] of Occupational Health - Occupational Stress Questionnaire    Feeling of Stress : Not at all  Social Connections: Socially Integrated (11/20/2022)   Social Connection and Isolation Panel [NHANES]    Frequency of Communication with Friends and Family: More than three times a week    Frequency of Social Gatherings with Friends and Family: More than three times a week    Attends Religious Services: More than 4 times  per year    Active Member of Clubs or Organizations: Yes    Attends Banker Meetings: More than 4 times per year    Marital Status: Married     Review of Systems  Constitutional:  Negative for appetite change and unexpected weight change.  HENT:  Negative for congestion and sinus pressure.   Respiratory:  Negative for cough, chest tightness and shortness of breath.   Cardiovascular:  Negative for chest pain, palpitations and leg swelling.  Gastrointestinal:  Negative for abdominal pain, diarrhea, nausea and vomiting.  Genitourinary:  Negative for difficulty urinating and dysuria.  Musculoskeletal:  Negative for joint swelling and myalgias.  Skin:  Negative for color change and rash.  Neurological:  Negative for dizziness and headaches.   Psychiatric/Behavioral:  Negative for agitation and dysphoric mood.        Objective:     BP 128/70   Pulse 67   Temp 98 F (36.7 C)   Resp 16   Ht 5' 5.5" (1.664 m)   Wt 130 lb (59 kg)   LMP 06/24/1993   SpO2 98%   BMI 21.30 kg/m  Wt Readings from Last 3 Encounters:  10/08/23 130 lb (59 kg)  04/09/23 125 lb 12.8 oz (57.1 kg)  01/11/23 124 lb (56.2 kg)    Physical Exam Vitals reviewed.  Constitutional:      General: She is not in acute distress.    Appearance: Normal appearance.  HENT:     Head: Normocephalic and atraumatic.     Right Ear: External ear normal.     Left Ear: External ear normal.     Mouth/Throat:     Pharynx: No oropharyngeal exudate or posterior oropharyngeal erythema.  Eyes:     General: No scleral icterus.       Right eye: No discharge.        Left eye: No discharge.     Conjunctiva/sclera: Conjunctivae normal.  Neck:     Thyroid : No thyromegaly.  Cardiovascular:     Rate and Rhythm: Normal rate and regular rhythm.  Pulmonary:     Effort: No respiratory distress.     Breath sounds: Normal breath sounds. No wheezing.  Abdominal:     General: Bowel sounds are normal.     Palpations: Abdomen is soft.     Tenderness: There is no abdominal tenderness.  Musculoskeletal:        General: No swelling or tenderness.     Cervical back: Neck supple. No tenderness.  Lymphadenopathy:     Cervical: No cervical adenopathy.  Skin:    Findings: No erythema or rash.  Neurological:     Mental Status: She is alert.  Psychiatric:        Mood and Affect: Mood normal.        Behavior: Behavior normal.         Outpatient Encounter Medications as of 10/08/2023  Medication Sig   Calcium  Carbonate-Vitamin D  600-400 MG-UNIT per chew tablet Chew 1 tablet by mouth 2 (two) times daily.    Cholecalciferol 1000 UNITS capsule Take 1,000 Units by mouth daily.   clobetasol cream (TEMOVATE) 0.05 % Apply 1 application topically 2 (two) times a week.    cyanocobalamin  (VITAMIN B12) 1000 MCG tablet Take 1,000 mcg by mouth daily.   denosumab  (PROLIA ) 60 MG/ML SOLN injection Inject 60 mg into the skin every 6 (six) months. Administer in upper arm, thigh, or abdomen   estradiol (ESTRACE) 0.1 MG/GM vaginal cream Place 2  g vaginally 3 (three) times a week.    ferrous sulfate 325 (65 FE) MG EC tablet Take 325 mg by mouth every other day.    pyridOXINE (VITAMIN B6) 25 MG tablet Take 25 mg by mouth daily.   rosuvastatin  (CRESTOR ) 10 MG tablet Take 1 tablet (10 mg total) by mouth daily.   [DISCONTINUED] rosuvastatin  (CRESTOR ) 10 MG tablet Take 1 tablet (10 mg total) by mouth daily.   No facility-administered encounter medications on file as of 10/08/2023.     Lab Results  Component Value Date   WBC 4.6 04/09/2023   HGB 13.2 04/09/2023   HCT 40.7 04/09/2023   PLT 200.0 04/09/2023   GLUCOSE 89 04/09/2023   CHOL 150 04/09/2023   TRIG 68.0 04/09/2023   HDL 54.20 04/09/2023   LDLDIRECT 157.7 06/24/2012   LDLCALC 82 04/09/2023   ALT 23 04/09/2023   AST 26 04/09/2023   NA 141 04/09/2023   K 4.2 04/09/2023   CL 106 04/09/2023   CREATININE 0.76 04/09/2023   BUN 12 04/09/2023   CO2 31 04/09/2023   TSH 4.26 04/09/2023   HGBA1C 5.9 04/09/2023    MM 3D DIAGNOSTIC MAMMOGRAM BILATERAL BREAST Result Date: 01/16/2023 CLINICAL DATA:  Patient presents for bilateral diagnostic examination due to 6 week history of right nipple inversion. No focal palpable abnormality. No nipple discharge. EXAM: DIGITAL DIAGNOSTIC BILATERAL MAMMOGRAM WITH TOMOSYNTHESIS AND CAD; ULTRASOUND RIGHT BREAST LIMITED TECHNIQUE: Bilateral digital diagnostic mammography and breast tomosynthesis was performed. The images were evaluated with computer-aided detection. ; Targeted ultrasound examination of the right breast was performed COMPARISON:  Previous exam(s). ACR Breast Density Category b: There are scattered areas of fibroglandular density. FINDINGS: Examination demonstrates no focal  abnormality in the right retroareolar region. Right nipple areolar complex is unchanged from 2020. Remainder of the right breast is unchanged. Left breast is unchanged. Targeted ultrasound is performed, showing no focal abnormality in the right retroareolar region. IMPRESSION: Stable mammogram. No focal abnormality involving the right nipple areolar complex or retroareolar region. Patient's nipple inversion is likely a normal variant. RECOMMENDATION: Recommend continued annual bilateral screening mammographic follow-up. I have discussed the findings and recommendations with the patient. If applicable, a reminder letter will be sent to the patient regarding the next appointment. BI-RADS CATEGORY  1: Negative. Electronically Signed   By: Roda Cirri M.D.   On: 01/16/2023 10:41   [place]  LIMITED ULTRASOUND INCLUDING AXILLA RIGHT BREAST Result Date: 01/16/2023 CLINICAL DATA:  Patient presents for bilateral diagnostic examination due to 6 week history of right nipple inversion. No focal palpable abnormality. No nipple discharge. EXAM: DIGITAL DIAGNOSTIC BILATERAL MAMMOGRAM WITH TOMOSYNTHESIS AND CAD; ULTRASOUND RIGHT BREAST LIMITED TECHNIQUE: Bilateral digital diagnostic mammography and breast tomosynthesis was performed. The images were evaluated with computer-aided detection. ; Targeted ultrasound examination of the right breast was performed COMPARISON:  Previous exam(s). ACR Breast Density Category b: There are scattered areas of fibroglandular density. FINDINGS: Examination demonstrates no focal abnormality in the right retroareolar region. Right nipple areolar complex is unchanged from 2020. Remainder of the right breast is unchanged. Left breast is unchanged. Targeted ultrasound is performed, showing no focal abnormality in the right retroareolar region. IMPRESSION: Stable mammogram. No focal abnormality involving the right nipple areolar complex or retroareolar region. Patient's nipple inversion is likely a  normal variant. RECOMMENDATION: Recommend continued annual bilateral screening mammographic follow-up. I have discussed the findings and recommendations with the patient. If applicable, a reminder letter will be sent to the patient regarding the next appointment.  BI-RADS CATEGORY  1: Negative. Electronically Signed   By: Roda Cirri M.D.   On: 01/16/2023 10:41       Assessment & Plan:  Hyperglycemia Assessment & Plan: Check met b and A1c with today's labs.   Orders: -     Hemoglobin A1c  Hypercholesteremia Assessment & Plan: Continue crestor . Low cholesterol diet and exercise. Follow lipid panel.   Orders: -     Hepatic function panel -     Basic metabolic panel with GFR -     Lipid panel  Visit for screening mammogram -     3D Screening Mammogram, Left and Right; Future  Vitamin B6 deficiency Assessment & Plan: On oral supplements. Check B6 level today  Orders: -     Vitamin B6  B12 deficiency Assessment & Plan: Taking oral supplements. Recheck B12 level with labs today.   Orders: -     Vitamin B12  Other orders -     Rosuvastatin  Calcium ; Take 1 tablet (10 mg total) by mouth daily.  Dispense: 100 tablet; Refill: 2     Dellar Fenton, MD

## 2023-10-11 LAB — VITAMIN B6: Vitamin B6: 79.8 ng/mL — ABNORMAL HIGH (ref 2.1–21.7)

## 2023-10-12 ENCOUNTER — Telehealth: Payer: Self-pay

## 2023-10-12 DIAGNOSIS — D225 Melanocytic nevi of trunk: Secondary | ICD-10-CM | POA: Diagnosis not present

## 2023-10-12 DIAGNOSIS — Z85828 Personal history of other malignant neoplasm of skin: Secondary | ICD-10-CM | POA: Diagnosis not present

## 2023-10-12 DIAGNOSIS — D2272 Melanocytic nevi of left lower limb, including hip: Secondary | ICD-10-CM | POA: Diagnosis not present

## 2023-10-12 DIAGNOSIS — D2271 Melanocytic nevi of right lower limb, including hip: Secondary | ICD-10-CM | POA: Diagnosis not present

## 2023-10-12 DIAGNOSIS — L814 Other melanin hyperpigmentation: Secondary | ICD-10-CM | POA: Diagnosis not present

## 2023-10-12 DIAGNOSIS — L570 Actinic keratosis: Secondary | ICD-10-CM | POA: Diagnosis not present

## 2023-10-12 DIAGNOSIS — L821 Other seborrheic keratosis: Secondary | ICD-10-CM | POA: Diagnosis not present

## 2023-10-12 DIAGNOSIS — D2261 Melanocytic nevi of right upper limb, including shoulder: Secondary | ICD-10-CM | POA: Diagnosis not present

## 2023-10-12 DIAGNOSIS — D2262 Melanocytic nevi of left upper limb, including shoulder: Secondary | ICD-10-CM | POA: Diagnosis not present

## 2023-10-12 NOTE — Telephone Encounter (Signed)
 Copied from CRM [id number]. Topic: Clinical - Medication Question >> Oct 12, 2023  3:57 PM Sonya Hernandez wrote: Reason for CRM: *question answered to Dr. Geralyn Knee* Patient called in and advised she is taking 1,000 mcg a day b12  B6 100mg  for 2wks by accident and then 25mg  for 3 wks

## 2023-10-15 NOTE — Telephone Encounter (Signed)
 See result note.

## 2023-11-21 ENCOUNTER — Ambulatory Visit (INDEPENDENT_AMBULATORY_CARE_PROVIDER_SITE_OTHER): Payer: Medicare HMO | Admitting: *Deleted

## 2023-11-21 VITALS — Ht 65.0 in | Wt 130.0 lb

## 2023-11-21 DIAGNOSIS — Z0000 Encounter for general adult medical examination without abnormal findings: Secondary | ICD-10-CM | POA: Diagnosis not present

## 2023-11-21 NOTE — Progress Notes (Signed)
 Subjective:   Sonya Hernandez is a 78 y.o. who presents for a Medicare Wellness preventive visit.  As a reminder, Annual Wellness Visits don't include a physical exam, and some assessments may be limited, especially if this visit is performed virtually. We may recommend an in-person follow-up visit with your provider if needed.  Visit Complete: Virtual I connected with  Sonya Hernandez on 11/21/23 by a audio enabled telemedicine application and verified that I am speaking with the correct person using two identifiers.  Patient Location: Home  Provider Location: Home Office  I discussed the limitations of evaluation and management by telemedicine. The patient expressed understanding and agreed to proceed.  Vital Signs: Because this visit was a virtual/telehealth visit, some criteria may be missing or patient reported. Any vitals not documented were not able to be obtained and vitals that have been documented are patient reported.  VideoDeclined- This patient declined Librarian, academic. Therefore the visit was completed with audio only.  Persons Participating in Visit: Patient.  AWV Questionnaire: Yes: Patient Medicare AWV questionnaire was completed by the patient on 11/20/23; I have confirmed that all information answered by patient is correct and no changes since this date.  Cardiac Risk Factors include: advanced age (>17men, >15 women);dyslipidemia     Objective:    Today's Vitals   11/21/23 0934  Weight: 130 lb (59 kg)  Height: 5' 5 (1.651 m)   Body mass index is 21.63 kg/m.     11/21/2023    9:44 AM 11/20/2022    9:28 AM 11/16/2021    9:01 AM 11/15/2020    8:27 AM 11/13/2019    8:50 AM 11/06/2018    9:09 AM 10/31/2017   10:28 AM  Advanced Directives  Does Patient Have a Medical Advance Directive? Yes No Yes Yes Yes Yes Yes   Type of Estate agent of [place];Living will  Healthcare Power of [place];Living will  Healthcare Power of [place];Living will Healthcare Power of [place];Living will Healthcare Power of [place];Living will Healthcare Power of [place];Living will  Does patient want to make changes to medical advance directive?   No - Patient declined No - Patient declined No - Patient declined No - Patient declined  No - Patient declined   Copy of Healthcare Power of Attorney in Chart? No - copy requested  No - copy requested No - copy requested No - copy requested No - copy requested  No - copy requested   Would patient like information on creating a medical advance directive?  No - Patient declined          Data saved with a previous flowsheet row definition    Current Medications (verified) Outpatient Encounter Medications as of 11/21/2023  Medication Sig   Calcium  Carbonate-Vitamin D  600-400 MG-UNIT per chew tablet Chew 1 tablet by mouth 2 (two) times daily.    Cholecalciferol 1000 UNITS capsule Take 1,000 Units by mouth daily.   clobetasol cream (TEMOVATE) 0.05 % Apply 1 application topically 2 (two) times a week.   cyanocobalamin  (VITAMIN B12) 1000 MCG tablet Take 1,000 mcg by mouth daily.   estradiol (ESTRACE) 0.1 MG/GM vaginal cream Place 2 g vaginally 3 (three) times a week.    ferrous sulfate 325 (65 FE) MG EC tablet Take 325 mg by mouth every other day.    pyridOXINE (VITAMIN B6) 25 MG tablet Take 25 mg by mouth daily.   rosuvastatin  (CRESTOR ) 10 MG tablet Take 1 tablet (10 mg total) by  mouth daily.   denosumab  (PROLIA ) 60 MG/ML SOLN injection Inject 60 mg into the skin every 6 (six) months. Administer in upper arm, thigh, or abdomen (Patient not taking: Reported on 11/21/2023)   No facility-administered encounter medications on file as of 11/21/2023.    Allergies (verified) Patient has no known allergies.   History: Past Medical History:  Diagnosis Date   Anemia    Cancer (HCC)    skin   Osteoporosis    Past Surgical History:  Procedure Laterality Date   NO PAST  SURGERIES     Family History  Problem Relation Age of Onset   Diabetes Mother    Thyroid  disease Mother        Questionable   Colon cancer Mother        Questionable   Heart attack Father    Deep vein thrombosis Father    Diabetes Sister    Breast cancer Daughter    Social History   Socioeconomic History   Marital status: Married    Spouse name: Not on file   Number of children: 3   Years of education: Not on file   Highest education level: Some college, no degree  Occupational History   Not on file  Tobacco Use   Smoking status: Never   Smokeless tobacco: Never  Vaping Use   Vaping status: Never Used  Substance and Sexual Activity   Alcohol use: No    Alcohol/week: 0.0 standard drinks of alcohol   Drug use: No   Sexual activity: Yes  Other Topics Concern   Not on file  Social History Narrative   She is married and has 3 daughters.   Social Drivers of Corporate investment banker Strain: Low Risk  (11/20/2023)   Overall Financial Resource Strain (CARDIA)    Difficulty of Paying Living Expenses: Not hard at all  Food Insecurity: No Food Insecurity (11/20/2023)   Hunger Vital Sign    Worried About Running Out of Food in the Last Year: Never true    Ran Out of Food in the Last Year: Never true  Transportation Needs: No Transportation Needs (11/20/2023)   PRAPARE - Administrator, Civil Service (Medical): No    Lack of Transportation (Non-Medical): No  Physical Activity: Sufficiently Active (11/20/2023)   Exercise Vital Sign    Days of Exercise per Week: 5 days    Minutes of Exercise per Session: 30 min  Stress: No Stress Concern Present (11/20/2023)   [employer] of Occupational Health - Occupational Stress Questionnaire    Feeling of Stress: Not at all  Social Connections: Socially Integrated (11/20/2023)   Social Connection and Isolation Panel    Frequency of Communication with Friends and Family: More than three times a week    Frequency of  Social Gatherings with Friends and Family: More than three times a week    Attends Religious Services: More than 4 times per year    Active Member of [employer] or Organizations: Yes    Attends Banker Meetings: 1 to 4 times per year    Marital Status: Married    Tobacco Counseling Counseling given: Not Answered    Clinical Intake:  Pre-visit preparation completed: Yes  Pain : No/denies pain     BMI - recorded: 21.63 Nutritional Status: BMI of 19-24  Normal Nutritional Risks: None Diabetes: No  Lab Results  Component Value Date   HGBA1C 6.0 10/08/2023   HGBA1C 5.9 04/09/2023  How often do you need to have someone help you when you read instructions, pamphlets, or other written materials from your doctor or pharmacy?: 1 - Never  Interpreter Needed?: No  Information entered by :: R. Fayette Hamada LPN   Activities of Daily Living     11/20/2023   10:37 PM  In your present state of health, do you have any difficulty performing the following activities:  Hearing? 1  Comment wears aids  Vision? 0  Comment readers  Difficulty concentrating or making decisions? 0  Walking or climbing stairs? 0  Dressing or bathing? 0  Doing errands, shopping? 0  Preparing Food and eating ? N  Using the Toilet? N  In the past six months, have you accidently leaked urine? Y  Do you have problems with loss of bowel control? N  Managing your Medications? N  Managing your Finances? N  Housekeeping or managing your Housekeeping? N    Patient Care Team: Dellar Fenton, MD as PCP - General (Internal Medicine) Lorelei Rogers Nicolas Barren, MD as Physician Assistant (Endocrinology) Rosan Comfort, MD as Consulting Physician (Neurology)  I have updated your Care Teams any recent Medical Services you may have received from other providers in the past year.     Assessment:   This is a routine wellness examination for Charlynn.  Hearing/Vision screen Hearing Screening - Comments:: Wears  aids Vision Screening - Comments:: readers   Goals Addressed             This Visit's Progress    Patient Stated       Wants to be able to continue to exercise and continue to learn things       Depression Screen     11/21/2023    9:40 AM 11/20/2022    9:26 AM 04/03/2022    8:31 AM 11/16/2021    9:05 AM 11/15/2020    8:26 AM 02/18/2020    8:18 AM 11/13/2019    8:44 AM  PHQ 2/9 Scores  PHQ - 2 Score 0 0 0 0 0 0 0  PHQ- 9 Score 0          Fall Risk     11/20/2023   10:37 PM 11/20/2022    9:28 AM 04/03/2022    8:31 AM 11/15/2020    8:29 AM 02/18/2020    8:18 AM  Fall Risk   Falls in the past year? 0 0 0 0 0  Number falls in past yr: 0 0 0 0 0  Injury with Fall? 0 0 0 0 0  Risk for fall due to : No Fall Risks No Fall Risks No Fall Risks    Follow up Falls evaluation completed;Falls prevention discussed Falls prevention discussed Falls evaluation completed  Falls evaluation completed  Falls evaluation completed      Data saved with a previous flowsheet row definition    MEDICARE RISK AT HOME:  Medicare Risk at Home Any stairs in or around the home?: (Patient-Rptd) Yes If so, are there any without handrails?: (Patient-Rptd) No Home free of loose throw rugs in walkways, pet beds, electrical cords, etc?: (Patient-Rptd) Yes Adequate lighting in your home to reduce risk of falls?: (Patient-Rptd) Yes Life alert?: (Patient-Rptd) No Use of a cane, walker or w/c?: (Patient-Rptd) No Grab bars in the bathroom?: (Patient-Rptd) No Shower chair or bench in shower?: (Patient-Rptd) Yes Elevated toilet seat or a handicapped toilet?: (Patient-Rptd) No  TIMED UP AND GO:  Was the test performed?  No  Cognitive Function:  6CIT completed    11/13/2019    8:50 AM 10/31/2017   10:41 AM 10/25/2016   10:39 AM  MMSE - Mini Mental State Exam  Not completed: Unable to complete    Orientation to time  5 5   Orientation to Place  5 5   Registration  3 3   Attention/ Calculation  5 5    Recall  3 3   Language- name 2 objects  2 2   Language- repeat  1 1  Language- follow 3 step command  3 3   Language- read & follow direction  1 1   Write a sentence  1 1   Copy design  1 1   Total score  30 30      Data saved with a previous flowsheet row definition        11/21/2023    9:44 AM 11/20/2022    9:29 AM 11/06/2018    9:12 AM  6CIT Screen  What Year? 0 points 0 points 0 points  What month? 0 points 0 points 0 points  What time? 0 points 0 points 0 points  Count back from 20 0 points 0 points 0 points  Months in reverse 0 points 0 points 0 points  Repeat phrase 0 points 0 points 0 points  Total Score 0 points 0 points 0 points    Immunizations Immunization History  Administered Date(s) Administered   Fluad Quad(high Dose 65+) 02/18/2020, 04/03/2022   Influenza Inj Mdck Quad Pf 03/16/2021   Influenza, High Dose Seasonal PF 06/07/2018   Influenza,inj,Quad PF,6+ Mos 06/25/2013, 06/25/2014, 06/28/2015   Influenza-Unspecified 06/14/2016, 02/26/2019   PFIZER(Purple Top)SARS-COV-2 Vaccination 06/12/2019, 07/03/2019, 03/08/2020   Pneumococcal Conjugate-13 06/25/2013   Pneumococcal Polysaccharide-23 06/28/2016   Zoster Recombinant(Shingrix) 05/16/2017, 08/28/2017    Screening Tests Health Maintenance  Topic Date Due   DTaP/Tdap/Td (1 - Tdap) Never done   COVID-19 Vaccine (4 - 2024-25 season) 02/04/2023   Medicare Annual Wellness (AWV)  11/20/2023   INFLUENZA VACCINE  01/04/2024   Pneumococcal Vaccine: 50+ Years  Completed   DEXA SCAN  Completed   Hepatitis C Screening  Completed   Zoster Vaccines- Shingrix  Completed   HPV VACCINES  Aged Out   Meningococcal B Vaccine  Aged Out   Colonoscopy  Discontinued    Health Maintenance  Health Maintenance Due  Topic Date Due   DTaP/Tdap/Td (1 - Tdap) Never done   COVID-19 Vaccine (4 - 2024-25 season) 02/04/2023   Medicare Annual Wellness (AWV)  11/20/2023   Health Maintenance Items Addressed: Discussed the  need to update tetanus (Tdap) and covid vaccines  Additional Screening:  Vision Screening: Recommended annual ophthalmology exams for early detection of glaucoma and other disorders of the eye. Up to date [place] Would you like a referral to an eye doctor? No    Dental Screening: Recommended annual dental exams for proper oral hygiene  Community Resource Referral / Chronic Care Management: CRR required this visit?  No   CCM required this visit?  No   Plan:    I have personally reviewed and noted the following in the patient's chart:   Medical and social history Use of alcohol, tobacco or illicit drugs  Current medications and supplements including opioid prescriptions. Patient is not currently taking opioid prescriptions. Functional ability and status Nutritional status Physical activity Advanced directives List of other physicians Hospitalizations, surgeries, and ER visits in previous 12 months Vitals Screenings to include cognitive, depression, and falls Referrals and  appointments  In addition, I have reviewed and discussed with patient certain preventive protocols, quality metrics, and best practice recommendations. A written personalized care plan for preventive services as well as general preventive health recommendations were provided to patient.   Felicitas Horse, LPN   06/23/1476   After Visit Summary: (MyChart) Due to this being a telephonic visit, the after visit summary with patients personalized plan was offered to patient via MyChart   Notes: Nothing significant to report at this time.

## 2023-11-21 NOTE — Patient Instructions (Signed)
 Sonya Hernandez , Thank you for taking time out of your busy schedule to complete your Annual Wellness Visit with me. I enjoyed our conversation and look forward to speaking with you again next year. I, as well as your care team,  appreciate your ongoing commitment to your health goals. Please review the following plan we discussed and let me know if I can assist you in the future. Your Game plan/ To Do List    Referrals: If you haven't heard from the office you've been referred to, please reach out to them at the phone provided.  Remember to update your Tetanus (Tdap) vaccine and covid vaccines Follow up Visits: Next Medicare AWV with our clinical staff: 11/24/24 @ 8:50   Have you seen your provider in the last 6 months (3 months if uncontrolled diabetes)? Yes Next Office Visit with your provider: 04/10/24  Clinician Recommendations:  Aim for 30 minutes of exercise or brisk walking, 6-8 glasses of water, and 5 servings of fruits and vegetables each day.       This is a list of the screening recommended for you and due dates:  Health Maintenance  Topic Date Due   DTaP/Tdap/Td vaccine (1 - Tdap) Never done   COVID-19 Vaccine (4 - 2024-25 season) 02/04/2023   Flu Shot  01/04/2024   Mammogram  01/16/2024   Medicare Annual Wellness Visit  11/20/2024   Pneumococcal Vaccine for age over 68  Completed   DEXA scan (bone density measurement)  Completed   Hepatitis C Screening  Completed   Zoster (Shingles) Vaccine  Completed   HPV Vaccine  Aged Out   Meningitis B Vaccine  Aged Out   Colon Cancer Screening  Discontinued    Advanced directives: (Copy Requested) Please bring a copy of your health care power of attorney and living will to the office to be added to your chart at your convenience. You can mail to [facility] [address] [place], [place] [address] or email to [email or web address] Advance Care Planning is important because it:  [x]  Makes sure you receive the  medical care that is consistent with your values, goals, and preferences  [x]  It provides guidance to your family and loved ones and reduces their decisional burden about whether or not they are making the right decisions based on your wishes.

## 2023-12-04 DIAGNOSIS — M810 Age-related osteoporosis without current pathological fracture: Secondary | ICD-10-CM | POA: Diagnosis not present

## 2023-12-28 DIAGNOSIS — M810 Age-related osteoporosis without current pathological fracture: Secondary | ICD-10-CM | POA: Diagnosis not present

## 2024-01-18 ENCOUNTER — Ambulatory Visit
Admission: RE | Admit: 2024-01-18 | Discharge: 2024-01-18 | Disposition: A | Discharge status: Home / Self Care | Source: Ambulatory Visit | Attending: Internal Medicine | Admitting: Internal Medicine

## 2024-01-18 DIAGNOSIS — Z1231 Encounter for screening mammogram for malignant neoplasm of breast: Secondary | ICD-10-CM | POA: Diagnosis not present

## 2024-04-10 ENCOUNTER — Ambulatory Visit: Admitting: Internal Medicine

## 2024-04-10 ENCOUNTER — Encounter: Payer: Self-pay | Admitting: Internal Medicine

## 2024-04-10 VITALS — BP 122/78 | HR 67 | Temp 97.7°F | Ht 64.5 in | Wt 122.5 lb

## 2024-04-10 DIAGNOSIS — Z1231 Encounter for screening mammogram for malignant neoplasm of breast: Secondary | ICD-10-CM | POA: Diagnosis not present

## 2024-04-10 DIAGNOSIS — M810 Age-related osteoporosis without current pathological fracture: Secondary | ICD-10-CM

## 2024-04-10 DIAGNOSIS — R739 Hyperglycemia, unspecified: Secondary | ICD-10-CM | POA: Diagnosis not present

## 2024-04-10 DIAGNOSIS — N6459 Other signs and symptoms in breast: Secondary | ICD-10-CM | POA: Diagnosis not present

## 2024-04-10 DIAGNOSIS — E7800 Pure hypercholesterolemia, unspecified: Secondary | ICD-10-CM

## 2024-04-10 DIAGNOSIS — Z0000 Encounter for general adult medical examination without abnormal findings: Secondary | ICD-10-CM

## 2024-04-10 LAB — BASIC METABOLIC PANEL WITH GFR
BUN: 17 mg/dL (ref 6–23)
CO2: 31 meq/L (ref 19–32)
Calcium: 9.1 mg/dL (ref 8.4–10.5)
Chloride: 103 meq/L (ref 96–112)
Creatinine, Ser: 0.76 mg/dL (ref 0.40–1.20)
GFR: 74.05 mL/min (ref >60.00)
Glucose, Bld: 87 mg/dL (ref 70–99)
Potassium: 4 meq/L (ref 3.5–5.1)
Sodium: 140 meq/L (ref 135–145)

## 2024-04-10 LAB — HEPATIC FUNCTION PANEL
ALT: 29 U/L (ref 0–35)
AST: 32 U/L (ref 0–37)
Albumin: 4 g/dL (ref 3.5–5.2)
Alkaline Phosphatase: 70 U/L (ref 39–117)
Bilirubin, Direct: 0.1 mg/dL (ref 0.0–0.3)
Total Bilirubin: 0.6 mg/dL (ref 0.2–1.2)
Total Protein: 6 g/dL (ref 6.0–8.3)

## 2024-04-10 LAB — LIPID PANEL
Cholesterol: 135 mg/dL (ref 0–200)
HDL: 41.4 mg/dL (ref >39.00)
LDL Cholesterol: 81 mg/dL (ref 0–99)
NonHDL: 93.71
Total CHOL/HDL Ratio: 3
Triglycerides: 65 mg/dL (ref 0.0–149.0)
VLDL: 13 mg/dL (ref 0.0–40.0)

## 2024-04-10 MED ORDER — ROSUVASTATIN CALCIUM 10 MG PO TABS: 10.0000 mg | ORAL_TABLET | Freq: Every day | ORAL | 2 refills | Status: AC

## 2024-04-10 NOTE — Progress Notes (Signed)
 Subjective:    Patient ID: Sonya Hernandez, female    DOB: 12/29/1945, 78 y.o.   MRN: [id number]  Patient here for  Chief Complaint  Patient presents with   Annual Exam    CPE    HPI Here for a physical exam. Seeing Dr Damian. Was receiving prolia .  Last bone density 12/2022 - INTERPRETATION: Osteoporosis. No significant change in bone mineral density from 10-26-2020.  Lumbar spine not reported due to scoliosis or sclerosis. Had a f/u with Dr Damian 06/25/23. Elected to start reclast - infusion 12/2023  Had NCS 08/22/22 - generalized polyneuropathy with superimposed left CTS.  Wearing a splint at night. Stays active. No chest pain or sob reported. No abdominal pain or bowel change reported. Now has hearing aids.    Past Medical History:  Diagnosis Date   Anemia    Cancer (HCC)    skin   Osteoporosis    Past Surgical History:  Procedure Laterality Date   NO PAST SURGERIES     Family History  Problem Relation Age of Onset   Diabetes Mother    Thyroid  disease Mother        Questionable   Colon cancer Mother        Questionable   Heart attack Father    Deep vein thrombosis Father    Diabetes Sister    Breast cancer Daughter    Social History   Socioeconomic History   Marital status: Married    Spouse name: Not on file   Number of children: 3   Years of education: Not on file   Highest education level: Some college, no degree  Occupational History   Not on file  Tobacco Use   Smoking status: Never   Smokeless tobacco: Never  Vaping Use   Vaping status: Never Used  Substance and Sexual Activity   Alcohol use: No    Alcohol/week: 0.0 standard drinks of alcohol   Drug use: No   Sexual activity: Yes  Other Topics Concern   Not on file  Social History Narrative   She is married and has 3 daughters.   Social Drivers of Corporate Investment Banker Strain: Low Risk  (11/20/2023)   Overall Financial Resource Strain (CARDIA)    Difficulty of Paying Living Expenses: Not  hard at all  Food Insecurity: No Food Insecurity (11/20/2023)   Hunger Vital Sign    Worried About Running Out of Food in the Last Year: Never true    Ran Out of Food in the Last Year: Never true  Transportation Needs: No Transportation Needs (11/20/2023)   PRAPARE - Administrator, Civil Service (Medical): No    Lack of Transportation (Non-Medical): No  Physical Activity: Sufficiently Active (11/20/2023)   Exercise Vital Sign    Days of Exercise per Week: 5 days    Minutes of Exercise per Session: 30 min  Stress: No Stress Concern Present (11/20/2023)   [employer] of Occupational Health - Occupational Stress Questionnaire    Feeling of Stress: Not at all  Social Connections: Socially Integrated (11/20/2023)   Social Connection and Isolation Panel    Frequency of Communication with Friends and Family: More than three times a week    Frequency of Social Gatherings with Friends and Family: More than three times a week    Attends Religious Services: More than 4 times per year    Active Member of [employer] or Organizations: Yes    Attends Banker  Meetings: 1 to 4 times per year    Marital Status: Married     Review of Systems  Constitutional:  Negative for appetite change and unexpected weight change.  HENT:  Negative for congestion, sinus pressure and sore throat.   Eyes:  Negative for pain and visual disturbance.  Respiratory:  Negative for cough, chest tightness and shortness of breath.   Cardiovascular:  Negative for chest pain, palpitations and leg swelling.  Gastrointestinal:  Negative for abdominal pain, diarrhea, nausea and vomiting.  Genitourinary:  Negative for difficulty urinating and dysuria.  Musculoskeletal:  Negative for joint swelling and myalgias.  Skin:  Negative for color change and rash.  Neurological:  Negative for dizziness and headaches.  Hematological:  Negative for adenopathy. Does not bruise/bleed easily.  Psychiatric/Behavioral:   Negative for agitation and dysphoric mood.        Objective:     BP 122/78   Pulse 67   Temp 97.7 F (36.5 C) (Oral)   Ht 5' 4.5 (1.638 m)   Wt 122 lb 8 oz (55.6 kg)   LMP 06/24/1993   SpO2 98%   BMI 20.70 kg/m  Wt Readings from Last 3 Encounters:  04/10/24 122 lb 8 oz (55.6 kg)  11/21/23 130 lb (59 kg)  10/08/23 130 lb (59 kg)    Physical Exam Vitals reviewed.  Constitutional:      General: She is not in acute distress.    Appearance: Normal appearance.  HENT:     Head: Normocephalic and atraumatic.     Right Ear: Tympanic membrane, ear canal and external ear normal.     Left Ear: Tympanic membrane, ear canal and external ear normal.     Mouth/Throat:     Pharynx: No oropharyngeal exudate or posterior oropharyngeal erythema.  Eyes:     General: No scleral icterus.       Right eye: No discharge.        Left eye: No discharge.     Conjunctiva/sclera: Conjunctivae normal.  Neck:     Thyroid : No thyromegaly.  Cardiovascular:     Rate and Rhythm: Normal rate and regular rhythm.  Pulmonary:     Effort: No respiratory distress.     Breath sounds: Normal breath sounds. No wheezing.     Comments: Breasts:  nipple inversion - right nipple - stable. No nipple discharge. No nodules or axillary adenopathy.  Abdominal:     General: Bowel sounds are normal.     Palpations: Abdomen is soft.     Tenderness: There is no abdominal tenderness.  Musculoskeletal:        General: No swelling or tenderness.     Cervical back: Neck supple. No tenderness.  Lymphadenopathy:     Cervical: No cervical adenopathy.  Skin:    Findings: No erythema or rash.  Neurological:     Mental Status: She is alert.  Psychiatric:        Mood and Affect: Mood normal.        Behavior: Behavior normal.         Outpatient Encounter Medications as of 04/10/2024  Medication Sig   Calcium  Carbonate-Vitamin D  600-10 MG-MCG TABS Take 1 tablet by mouth 2 (two) times daily.   Cholecalciferol 1000  UNITS capsule Take 1,000 Units by mouth daily.   clobetasol cream (TEMOVATE) 0.05 % Apply 1 application topically 2 (two) times a week.   cyanocobalamin  (VITAMIN B12) 1000 MCG tablet Take 1,000 mcg by mouth daily.   estradiol (ESTRACE) 0.1  MG/GM vaginal cream Place 2 g vaginally 3 (three) times a week.    ferrous sulfate 325 (65 FE) MG EC tablet Take 325 mg by mouth every other day.    pyridOXINE (VITAMIN B6) 25 MG tablet Take 25 mg by mouth daily.   rosuvastatin  (CRESTOR ) 10 MG tablet Take 1 tablet (10 mg total) by mouth daily.   [DISCONTINUED] denosumab  (PROLIA ) 60 MG/ML SOLN injection Inject 60 mg into the skin every 6 (six) months. Administer in upper arm, thigh, or abdomen (Patient not taking: Reported on 11/21/2023)   [DISCONTINUED] rosuvastatin  (CRESTOR ) 10 MG tablet Take 1 tablet (10 mg total) by mouth daily.   No facility-administered encounter medications on file as of 04/10/2024.     Lab Results  Component Value Date   WBC 4.6 04/09/2023   HGB 13.2 04/09/2023   HCT 40.7 04/09/2023   PLT 200.0 04/09/2023   GLUCOSE 87 04/10/2024   CHOL 135 04/10/2024   TRIG 65.0 04/10/2024   HDL 41.40 04/10/2024   LDLDIRECT 157.7 06/24/2012   LDLCALC 81 04/10/2024   ALT 29 04/10/2024   AST 32 04/10/2024   NA 140 04/10/2024   K 4.0 04/10/2024   CL 103 04/10/2024   CREATININE 0.76 04/10/2024   BUN 17 04/10/2024   CO2 31 04/10/2024   TSH 4.26 04/09/2023   HGBA1C 6.0 10/08/2023    MM 3D SCREENING MAMMOGRAM BILATERAL BREAST Result Date: 01/21/2024 CLINICAL DATA:  Screening. EXAM: DIGITAL SCREENING BILATERAL MAMMOGRAM WITH TOMOSYNTHESIS AND CAD TECHNIQUE: Bilateral screening digital craniocaudal and mediolateral oblique mammograms were obtained. Bilateral screening digital breast tomosynthesis was performed. The images were evaluated with computer-aided detection. COMPARISON:  Previous exam(s). ACR Breast Density Category b: There are scattered areas of fibroglandular density. FINDINGS: There  are no findings suspicious for malignancy. IMPRESSION: No mammographic evidence of malignancy. A result letter of this screening mammogram will be mailed directly to the patient. RECOMMENDATION: Screening mammogram in one year. (Code:SM-B-01Y) BI-RADS CATEGORY  1: Negative. Electronically Signed   By: Reyes Phi M.D.   On: 01/21/2024 16:42       Assessment & Plan:  Health care maintenance Assessment & Plan: Physical today 04/10/24.  Mammogram 01/18/24 - Birads I.  Colonoscopy 11/2019 (Dr Aundria).  Bone density 01/01/23 - INTERPRETATION: Osteoporosis. No significant change in bone mineral density from 10-26-2020. Lumbar spine not reported due to scoliosis or sclerosis. Received reclast infusion 12/2023.    Hypercholesteremia Assessment & Plan: Continue crestor . Low cholesterol diet and exercise. Follow lipid panel.   Orders: -     Lipid panel -     Basic metabolic panel with GFR -     Hepatic function panel  Visit for screening mammogram -     3D Screening Mammogram, Left and Right; Future  Osteoporosis without current pathological fracture, unspecified osteoporosis type Assessment & Plan: Seeing Dr Damian. Last bone density 12/2022 - INTERPRETATION: Osteoporosis. No significant change in bone mineral density from 10-26-2020.  Lumbar spine not reported due to scoliosis or sclerosis. Had a f/u with Dr Damian 06/25/23. Received reclast infusion - 12/2023. Vitamin D  level checked and wnl.    Inversion of nipple Assessment & Plan: Had diagnostic mammogram in 01/2023.  Ok.  Recommended continued screening in one year.  Discussed with her today.  Hold on surgery referral.  Continue to follow with mammogram as recommended. Mammogram 01/18/24 - Birads I.    Hyperglycemia Assessment & Plan: Follow met b and A1c.    Other orders -  Rosuvastatin  Calcium ; Take 1 tablet (10 mg total) by mouth daily.  Dispense: 100 tablet; Refill: 2     Allena Hamilton, MD

## 2024-04-10 NOTE — Assessment & Plan Note (Signed)
 Seeing Dr Damian. Last bone density 12/2022 - INTERPRETATION: Osteoporosis. No significant change in bone mineral density from 10-26-2020.  Lumbar spine not reported due to scoliosis or sclerosis. Had a f/u with Dr Damian 06/25/23. Received reclast infusion - 12/2023. Vitamin D  level checked and wnl.

## 2024-04-10 NOTE — Assessment & Plan Note (Signed)
 Physical today 04/10/24.  Mammogram 01/18/24 - Birads I.  Colonoscopy 11/2019 (Dr Aundria).  Bone density 01/01/23 - INTERPRETATION: Osteoporosis. No significant change in bone mineral density from 10-26-2020. Lumbar spine not reported due to scoliosis or sclerosis. Received reclast infusion 12/2023.

## 2024-04-11 ENCOUNTER — Ambulatory Visit: Payer: Self-pay | Admitting: Internal Medicine

## 2024-04-20 ENCOUNTER — Encounter: Payer: Self-pay | Admitting: Internal Medicine

## 2024-04-20 NOTE — Assessment & Plan Note (Signed)
 Follow met b and A1c.

## 2024-04-20 NOTE — Assessment & Plan Note (Signed)
 Had diagnostic mammogram in 01/2023.  Ok.  Recommended continued screening in one year.  Discussed with her today.  Hold on surgery referral.  Continue to follow with mammogram as recommended. Mammogram 01/18/24 - Birads I.

## 2024-04-20 NOTE — Assessment & Plan Note (Signed)
Continue crestor.  Low cholesterol diet and exercise.  Follow lipid panel.  

## 2024-10-09 ENCOUNTER — Ambulatory Visit: Admitting: Internal Medicine

## 2024-11-24 ENCOUNTER — Ambulatory Visit
